# Patient Record
Sex: Male | Born: 1940 | Race: White | Hispanic: No | Marital: Married | Smoking: Former smoker
Health system: Southern US, Community
[De-identification: ages and names within clinical notes are randomized; demographics above are authoritative.]

## PROBLEM LIST (undated history)

## (undated) DIAGNOSIS — G35 Multiple sclerosis: Secondary | ICD-10-CM

## (undated) DIAGNOSIS — R339 Retention of urine, unspecified: Secondary | ICD-10-CM

## (undated) DIAGNOSIS — Z9359 Other cystostomy status: Secondary | ICD-10-CM

## (undated) DIAGNOSIS — R310 Gross hematuria: Secondary | ICD-10-CM

## (undated) HISTORY — DX: Other cystostomy status: Z93.59

## (undated) HISTORY — DX: Retention of urine, unspecified: R33.9

## (undated) HISTORY — PX: OTHER SURGICAL HISTORY: SHX169

## (undated) HISTORY — DX: Multiple sclerosis: G35

## (undated) HISTORY — DX: Gross hematuria: R31.0

---

## 2004-03-30 ENCOUNTER — Inpatient Hospital Stay: Payer: Self-pay | Admitting: Internal Medicine

## 2004-03-30 ENCOUNTER — Other Ambulatory Visit: Payer: Self-pay

## 2004-05-08 ENCOUNTER — Ambulatory Visit: Payer: Self-pay | Admitting: Internal Medicine

## 2005-05-05 ENCOUNTER — Inpatient Hospital Stay: Payer: Self-pay | Admitting: Internal Medicine

## 2005-09-26 ENCOUNTER — Emergency Department: Payer: Self-pay | Admitting: Emergency Medicine

## 2006-05-05 ENCOUNTER — Ambulatory Visit: Payer: Self-pay | Admitting: Urology

## 2006-05-26 ENCOUNTER — Ambulatory Visit: Payer: Self-pay | Admitting: Urology

## 2006-05-26 ENCOUNTER — Other Ambulatory Visit: Payer: Self-pay

## 2006-06-01 ENCOUNTER — Ambulatory Visit: Payer: Self-pay | Admitting: Urology

## 2006-09-08 ENCOUNTER — Emergency Department: Payer: Self-pay | Admitting: Emergency Medicine

## 2006-09-08 ENCOUNTER — Other Ambulatory Visit: Payer: Self-pay

## 2006-09-21 ENCOUNTER — Ambulatory Visit: Payer: Self-pay | Admitting: Unknown Physician Specialty

## 2006-09-28 ENCOUNTER — Ambulatory Visit: Payer: Self-pay | Admitting: Internal Medicine

## 2006-10-18 ENCOUNTER — Ambulatory Visit: Payer: Self-pay | Admitting: Unknown Physician Specialty

## 2008-03-21 ENCOUNTER — Ambulatory Visit: Payer: Self-pay | Admitting: Internal Medicine

## 2008-03-22 ENCOUNTER — Ambulatory Visit: Payer: Self-pay | Admitting: Internal Medicine

## 2008-04-07 ENCOUNTER — Ambulatory Visit: Payer: Self-pay | Admitting: Internal Medicine

## 2008-06-05 ENCOUNTER — Ambulatory Visit: Payer: Self-pay | Admitting: Internal Medicine

## 2008-07-30 IMAGING — CR DG IVP HYPERTENSIVE
1 series · 8 of 10 positions shown · non-contrast
Comparison: none

REASON FOR EXAM: UTI
COMMENTS:

[Series 1: view not recorded · 0.17mm/px · 8 of 11 slices shown]
[im 1/11]
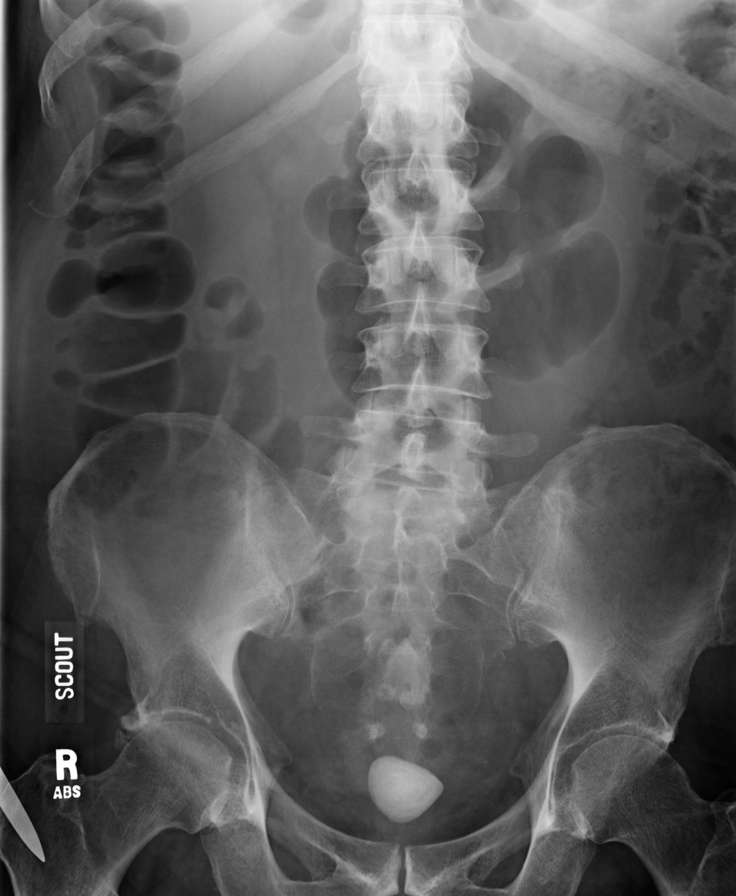
[im 2/11]
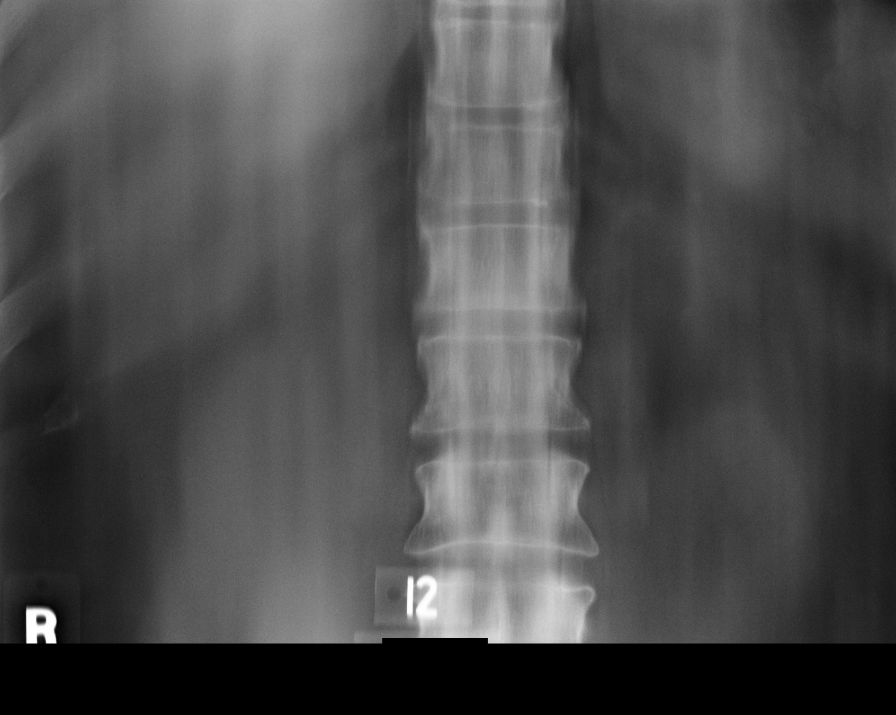
[im 3/11]
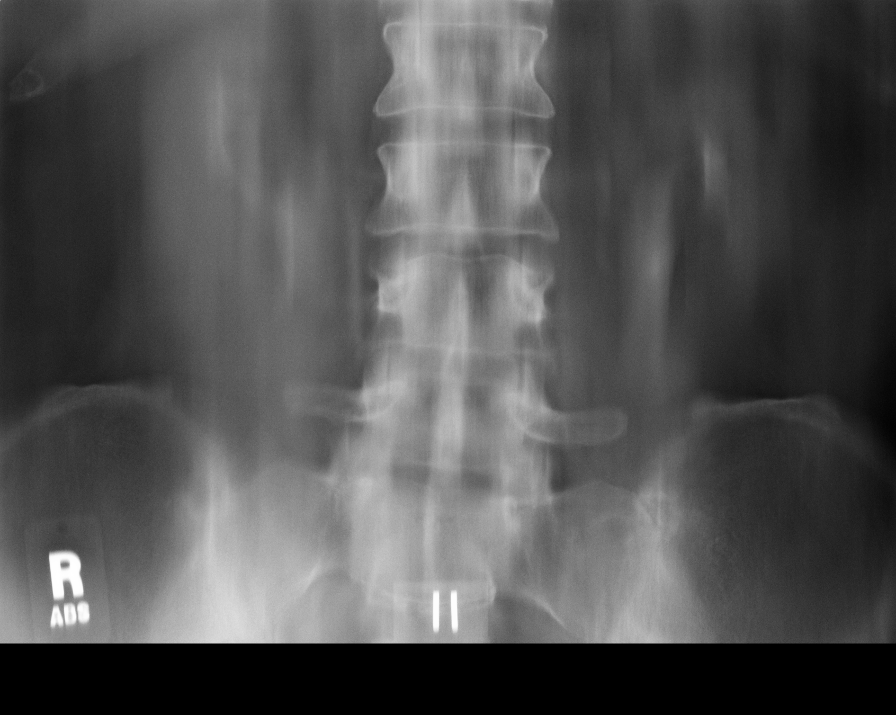
[im 4/11]
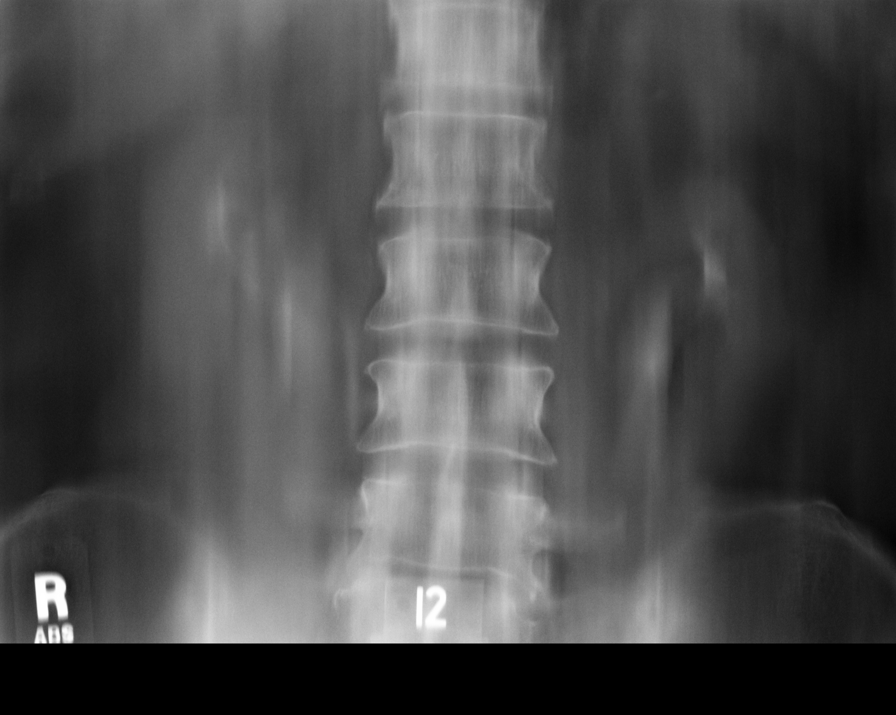
[im 5/11]
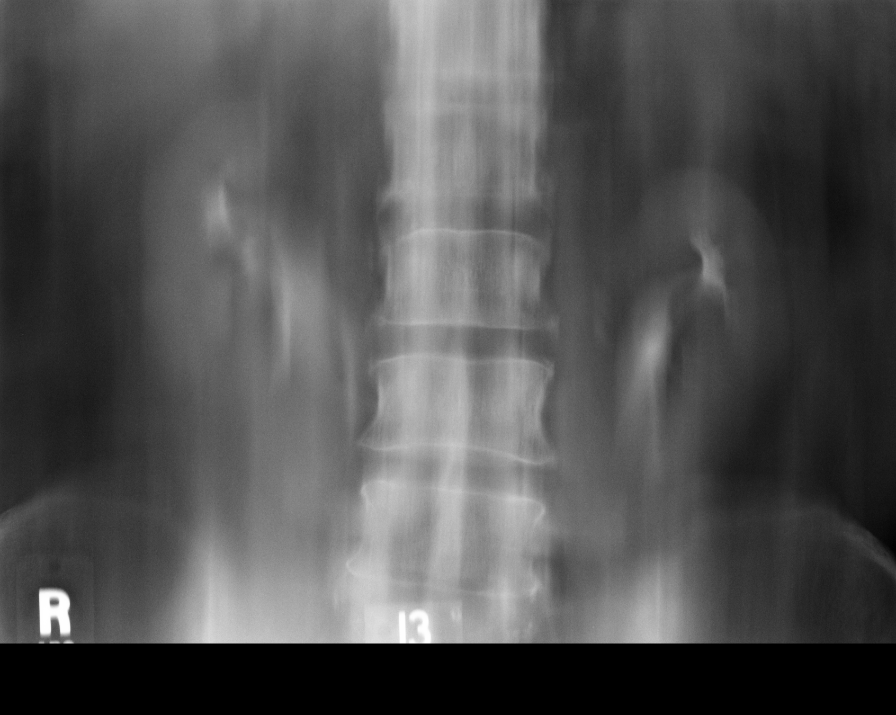
[im 6/11]
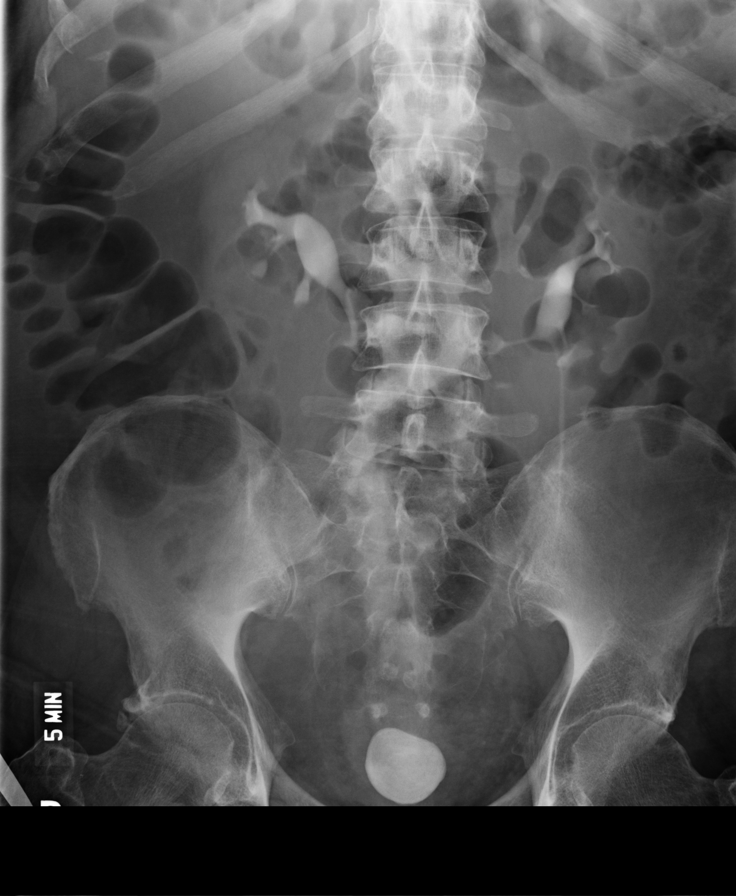
[im 7/11]
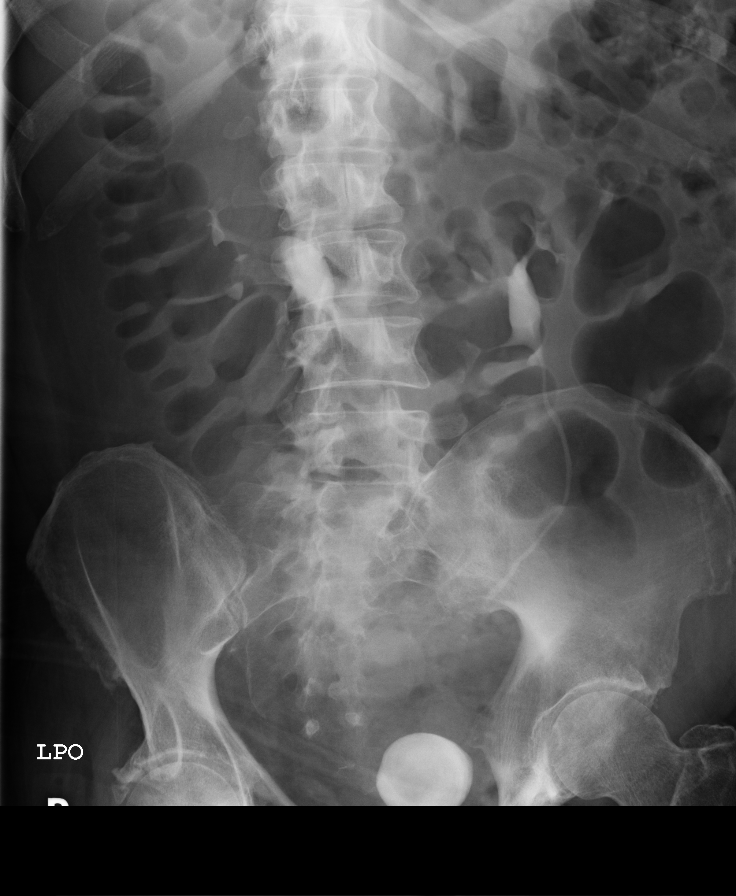
[im 8/11]
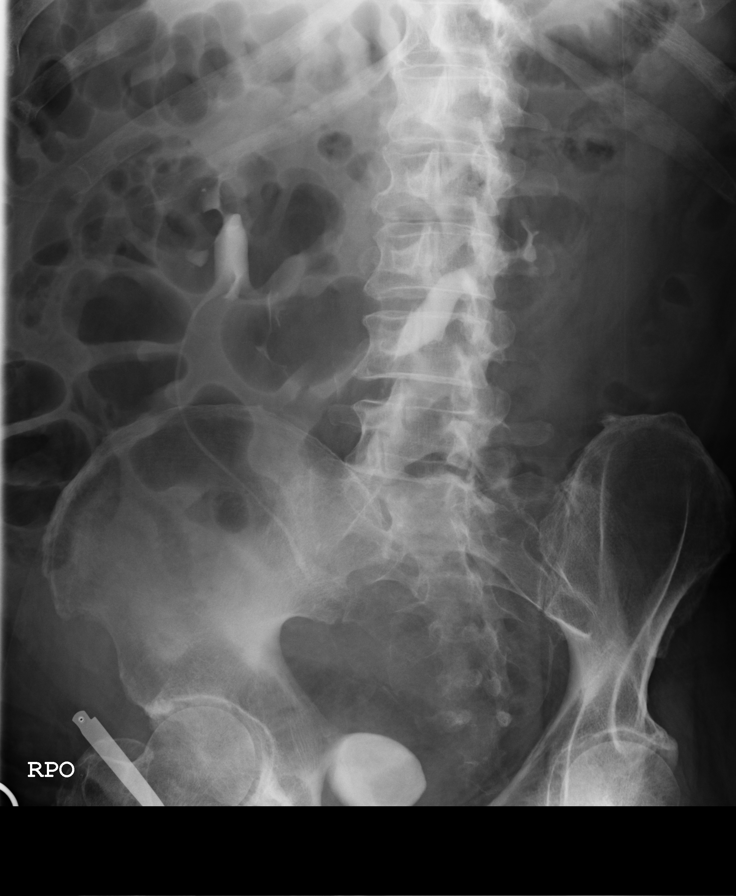

[8 of 10 positions shown; findings below may reference images not displayed]

PROCEDURE:     DXR - DXR INTRAVENOUS UROGRAPHY (IVP)  - May 05, 2006  [DATE]

RESULT:     A large laminated calcified density projects in the region of
the central lower true pelvis which appears to be consistent with a bladder
calculus.  Two vague calcified densities project along the medial aspect of
the RIGHT renal fossa.  This projects in the region of the transverse
process and may represent previous injury though medial calculi cannot be
completely excluded.  No further evidence of calcified densities project in
the region of the RIGHT or LEFT urinary collecting system.  Air is seen
within non-dilated loops of large and small bowel. The visualized bony
skeleton demonstrates no evidence of fracture nor dislocation.  Degenerative
changes are appreciated involving the RIGHT and LEFT hips.

Intravenous urogram was performed status post intravenous administration of
50 ml of Optiray 320.

Prompt symmetric nephrograms are appreciated, which appears to extend across
the midline.  There is subsequent symmetric excretion of contrast
appreciated into the RIGHT and LEFT renal pelves.  The renal pelves
demonstrate a medially oriented appearance and these findings are consistent
with a horseshoe kidney.  No gross filling defects are appreciated.  The
renal parenchyma is poorly opacified.  Evaluation of the fornices
demonstrates wispy fornices without blunting nor hydronephrosis.  The renal
pelves otherwise demonstrate no evidence of filling defects nor further
abnormalities. The RIGHT and LEFT ureters are identified to the level of the
urinary bladder and demonstrate no abnormalities. A suprapubic catheter is
appreciated with contrast drainage.  The partially distended urinary bladder
demonstrates no radiographic abnormalities.
IMPRESSION: 1)Findings consistent with a large laminated calculus in the urinary
bladder.

2)Findings also consistent with a horseshoe kidney.  No further
abnormalities are appreciated. The findings of the horseshoe kidney can be
further evaluated with cross sectional imaging if and as clinically
warranted.

## 2008-12-24 ENCOUNTER — Ambulatory Visit: Payer: Self-pay | Admitting: Unknown Physician Specialty

## 2009-01-09 ENCOUNTER — Ambulatory Visit: Payer: Self-pay | Admitting: Surgery

## 2009-01-15 ENCOUNTER — Ambulatory Visit: Payer: Self-pay | Admitting: Surgery

## 2009-11-20 ENCOUNTER — Ambulatory Visit: Payer: Self-pay | Admitting: Internal Medicine

## 2011-05-20 ENCOUNTER — Ambulatory Visit: Payer: Self-pay | Admitting: Internal Medicine

## 2012-10-10 ENCOUNTER — Ambulatory Visit: Payer: Self-pay | Admitting: Internal Medicine

## 2013-09-18 ENCOUNTER — Ambulatory Visit: Payer: Self-pay | Admitting: Urology

## 2014-01-15 DIAGNOSIS — IMO0001 Reserved for inherently not codable concepts without codable children: Secondary | ICD-10-CM | POA: Insufficient documentation

## 2014-01-15 DIAGNOSIS — K921 Melena: Secondary | ICD-10-CM | POA: Insufficient documentation

## 2014-01-16 ENCOUNTER — Ambulatory Visit: Payer: Self-pay | Admitting: Unknown Physician Specialty

## 2014-04-16 DIAGNOSIS — E039 Hypothyroidism, unspecified: Secondary | ICD-10-CM | POA: Insufficient documentation

## 2014-04-16 DIAGNOSIS — D649 Anemia, unspecified: Secondary | ICD-10-CM | POA: Insufficient documentation

## 2014-04-16 DIAGNOSIS — N319 Neuromuscular dysfunction of bladder, unspecified: Secondary | ICD-10-CM | POA: Insufficient documentation

## 2014-10-17 DIAGNOSIS — G35 Multiple sclerosis: Secondary | ICD-10-CM | POA: Insufficient documentation

## 2015-04-30 ENCOUNTER — Telehealth: Payer: Self-pay

## 2015-04-30 NOTE — Telephone Encounter (Signed)
Becky from home health called stating pt SP tube has a hole in foley tube. Per Dr. Apolinar Junes orders were given for a one time SP tube change.  Pt needs to be seen for f/u appt.

## 2015-05-07 ENCOUNTER — Encounter: Payer: Self-pay | Admitting: *Deleted

## 2015-05-07 DIAGNOSIS — K635 Polyp of colon: Secondary | ICD-10-CM | POA: Insufficient documentation

## 2015-05-08 ENCOUNTER — Ambulatory Visit: Payer: Self-pay | Admitting: Obstetrics and Gynecology

## 2015-05-09 ENCOUNTER — Ambulatory Visit (INDEPENDENT_AMBULATORY_CARE_PROVIDER_SITE_OTHER): Payer: Medicare HMO | Admitting: Obstetrics and Gynecology

## 2015-05-09 ENCOUNTER — Encounter: Payer: Self-pay | Admitting: Obstetrics and Gynecology

## 2015-05-09 VITALS — BP 167/88 | HR 61 | Resp 16

## 2015-05-09 DIAGNOSIS — N39498 Other specified urinary incontinence: Secondary | ICD-10-CM

## 2015-05-09 DIAGNOSIS — R911 Solitary pulmonary nodule: Secondary | ICD-10-CM

## 2015-05-09 DIAGNOSIS — Q631 Lobulated, fused and horseshoe kidney: Secondary | ICD-10-CM

## 2015-05-09 DIAGNOSIS — N200 Calculus of kidney: Secondary | ICD-10-CM

## 2015-05-09 NOTE — Progress Notes (Signed)
05/09/2015 4:56 PM   Scott Valdez 07/27/1940 [id number]  Referring provider: No referring provider defined for this encounter.  Chief Complaint  Patient presents with  . OTHER    SP tube check  . Urinary Retention    HPI:   Patient is a 75 year old man with a history of multiple sclerosis and urinary incontinence managed with a suprapubic tube since 1990. He presents today with his wife with complaints of clogging of catheter and leakage from his penis. His wife reports over the last month she has noticed increased sediment in his catheter tubing and clogging of the tube. She reports that she can manipulate the tubing and sometimes unclog it herself. Most recent catheter change was 1 week ago by home health nurse. They have noted a strong odor to his urine. 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 He states that he is also had bladder stones in the past that were removed by Dr. Orson Slick. He has not been seen in our office since May of last year. He denies any systemic symptoms including fevers, abdominal pain, malaise or flank pain. No gross hematuria.  Per Dr. Scheryl Darter last note patient has a 4 mm nonobstructive stone in the left kidney and a small lung nodule that was seen on CT scan needing follow-up in one year. He recommended no intervention for his kidney stone unless patient becomes symptomatic. Last office visit 09/2014.  PMH: Past Medical History  Diagnosis Date  . MS (multiple sclerosis) (HCC)   . Chronic suprapubic catheter (HCC)   . Urinary retention   . Gross hematuria     Surgical History: Past Surgical History  Procedure Laterality Date  . Cystolithopaxy    . Suprapubic tube placement      Home Medications:    Medication List       This list is accurate as of: 05/09/15  4:56 PM.  Always use your most recent med list.               levothyroxine 175 MCG tablet  Commonly known as:  SYNTHROID, LEVOTHROID  Take by mouth.     RA  COMPLETE ALLERGY 25 MG tablet  Generic drug:  diphenhydrAMINE  TAKE 1 TO 2 TABLETS EVERY 4 HOURS AS NEEDED FOR ITCHING (NOT TO EXCEED 6 DOSES/24 HOURS)        Allergies:  Allergies  Allergen Reactions  . Pseudoephedrine Hcl     Other reaction(s): Unknown  . Ranitidine Hcl     Other reaction(s): Unknown  . Sucralfate     Other reaction(s): Unknown    Family History: Family History  Problem Relation Age of Onset  . Diabetes Mother     Social History:  reports that he quit smoking about 45 years ago. He does not have any smokeless tobacco history on file. He reports that he does not drink alcohol. His drug history is not on file.  ROS: UROLOGY Frequent Urination?: No Hard to postpone urination?: No Burning/pain with urination?: No Get up at night to urinate?: No Leakage of urine?: No Urine stream starts and stops?: No Trouble starting stream?: No Do you have to strain to urinate?: No Blood in urine?: No Urinary tract infection?: No Sexually transmitted disease?: No Injury to kidneys or bladder?: No Painful intercourse?: No Weak stream?: No Erection problems?: No Penile pain?: No  Gastrointestinal Nausea?: No Vomiting?: No Indigestion/heartburn?: No Diarrhea?: No Constipation?: No  Constitutional Fever: No Night sweats?: No Weight loss?: No Fatigue?:  No  Skin Skin rash/lesions?: No Itching?: No  Eyes Blurred vision?: No Double vision?: No  Ears/Nose/Throat Sore throat?: No Sinus problems?: No  Hematologic/Lymphatic Swollen glands?: No Easy bruising?: No  Cardiovascular Leg swelling?: Yes Chest pain?: No  Respiratory Cough?: No Shortness of breath?: No  Endocrine Excessive thirst?: No  Musculoskeletal Back pain?: No Joint pain?: No  Neurological Headaches?: No Dizziness?: No  Psychologic Depression?: No Anxiety?: No  Physical Exam: BP 167/88 mmHg  Pulse 61  Resp 16  Ht   Wt   Constitutional:  Alert and oriented, No  acute distress. HEENT: [place] AT, moist mucus membranes.  Trachea midline, no masses. Cardiovascular: No clubbing, cyanosis, or edema. Respiratory: Normal respiratory effort, no increased work of breathing. GI: Abdomen is soft, nontender, nondistended, no abdominal masses GU: No CVA tenderness.  Catheter draining well from SP site, significant sediment noted in tubing, small amount of granulation tissue at site without active bleeding Skin: No rashes, bruises or suspicious lesions. Lymph: No cervical or inguinal adenopathy. Neurologic: Grossly intact, no focal deficits, wheelchair bound, able to move upper extremities some Psychiatric: Normal mood and affect.  Laboratory Data:   Urinalysis No results found for: COLORURINE, APPEARANCEUR, LABSPEC, PHURINE, GLUCOSEU, HGBUR, BILIRUBINUR, KETONESUR, PROTEINUR, UROBILINOGEN, NITRITE, LEUKOCYTESUR  Pertinent Imaging:   Assessment & Plan:    1. Urinary Incontinence-  Managed with long term s/p tube.  Changed monthly by home health nurse. Large amount of sediment noted in tubing.  Draining well today. Encouraged increased fluid intake. History of bladder stones. Will schedule cystoscopy. UA obtained today from catheter hub.  Quantity insufficient for microscopy or culture. Patient provided specimen cup and will return with sample.  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  2. Horseshoe Kidney with renal calculi- RTC for KUB/RUS results. -KUB -RUS  3. Lung Nodule-  Patient wife informed that patient had a lung nodule seen on prior CT scan per Dr. Scheryl Darter note.  I am unable to see images or report.  I recommended that patient f/u with his PCP regarding recommended repeat CT.  There are no diagnoses linked to this encounter.  Return for KUB/RUS results and cystoscopy.  These notes generated with voice recognition software. I apologize for typographical  errors.  Earlie Lou, FNP  [facility] [address], [address] [place], [place] [address] [phone]

## 2015-05-10 LAB — URINALYSIS, COMPLETE
Bilirubin, UA: NEGATIVE
Bilirubin, UA: NEGATIVE
GLUCOSE, UA: NEGATIVE
Glucose, UA: NEGATIVE
KETONES UA: NEGATIVE
Ketones, UA: NEGATIVE
Nitrite, UA: POSITIVE — AB
Nitrite, UA: POSITIVE — AB
PH UA: 7 (ref 5.0–7.5)
Specific Gravity, UA: 1.02 (ref 1.005–1.030)
Specific Gravity, UA: 1.02 (ref 1.005–1.030)
UUROB: 0.2 mg/dL (ref 0.2–1.0)
Urobilinogen, Ur: 0.2 mg/dL (ref 0.2–1.0)
pH, UA: 7 (ref 5.0–7.5)

## 2015-05-10 LAB — MICROSCOPIC EXAMINATION: Epithelial Cells (non renal): NONE SEEN /hpf (ref 0–10)

## 2015-05-10 NOTE — Addendum Note (Signed)
Addended by: Avie Echevaria on: 05/10/2015 10:54 AM   Modules accepted: Orders

## 2015-05-13 LAB — CULTURE, URINE COMPREHENSIVE

## 2015-05-16 ENCOUNTER — Telehealth: Payer: Self-pay

## 2015-05-16 NOTE — Telephone Encounter (Signed)
Spoke with pt wife in reference ucx results. Wife stated pt is not having any symptoms are this time. Made wife aware at this time we will not treat. Wife voiced understanding.

## 2015-05-16 NOTE — Telephone Encounter (Signed)
-----   Message from Fernanda Drum, FNP sent at 05/16/2015 11:09 AM EST ----- Please notify patient and his wife that his urine culture result was positive for multiple organisms but none more predominant. This is typically consistent with chronic colonization which is typical for someone that has an indwelling catheter. Should he develop symptoms of fevers and malaise or other urinary symptoms we can recheck his urine but otherwise he does not need to be treated for an infection. Thanks

## 2015-05-21 ENCOUNTER — Ambulatory Visit
Admission: RE | Admit: 2015-05-21 | Discharge: 2015-05-21 | Disposition: A | Discharge status: Home / Self Care | Payer: Medicare HMO | Source: Ambulatory Visit | Attending: Obstetrics and Gynecology | Admitting: Obstetrics and Gynecology

## 2015-05-21 DIAGNOSIS — N200 Calculus of kidney: Secondary | ICD-10-CM | POA: Diagnosis present

## 2015-05-21 DIAGNOSIS — Q631 Lobulated, fused and horseshoe kidney: Secondary | ICD-10-CM | POA: Insufficient documentation

## 2015-05-22 ENCOUNTER — Telehealth: Payer: Self-pay

## 2015-05-22 NOTE — Telephone Encounter (Signed)
Pt wife called this morning stating pt SP tube has become clogged. Wife stated she has tried to irrigate catheter and that does not seem to help. Per wife home health needs an order to change SP tube.  Nurse called [facility] and gave verbal orders to [facility], to change catheter.  Spoke with wife and made aware of orders given. Wife voiced understanding.

## 2015-05-24 ENCOUNTER — Ambulatory Visit (INDEPENDENT_AMBULATORY_CARE_PROVIDER_SITE_OTHER): Payer: Medicare HMO | Admitting: Urology

## 2015-05-24 VITALS — BP 172/85 | HR 70 | Ht 67.0 in | Wt 200.0 lb

## 2015-05-24 DIAGNOSIS — N319 Neuromuscular dysfunction of bladder, unspecified: Secondary | ICD-10-CM

## 2015-05-24 MED ORDER — LIDOCAINE HCL 2 % EX GEL
1.0000 "application " | Freq: Once | CUTANEOUS | Status: AC
  Administered 2015-05-24: 1 via URETHRAL

## 2015-05-24 MED ORDER — CIPROFLOXACIN HCL 500 MG PO TABS
500.0000 mg | ORAL_TABLET | Freq: Once | ORAL | Status: AC
  Administered 2015-05-24: 500 mg via ORAL

## 2015-05-24 NOTE — Progress Notes (Signed)
05/24/2015 3:16 PM   Scott Valdez 24-Nov-1940 [id number]  Referring provider: Rafael Bihari, MD [phone] [facility] [address] [facility] [place], [place] [address]  Chief Complaint  Patient presents with  . Cysto    HPI: Patient is a 75 year old man with a history of multiple sclerosis and urinary incontinence managed with a suprapubic tube since 1990. He presents today with his wife with complaints of clogging of catheter and leakage from his penis. His wife reports over the last month she has noticed increased sediment in his catheter tubing and clogging of the tube. She reports that she can manipulate the tubing and sometimes unclog it herself. Most recent catheter change was 1 week ago by home health nurse. They have noted a strong odor to his urine. 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 He states that he is also had bladder stones in the past that were removed by Dr. Orson Slick. He has not been seen in our office since May of last year. He denies any systemic symptoms including fevers, abdominal pain, malaise or flank pain. No gross hematuria.  Per Dr. Scheryl Darter last note patient has a 4 mm nonobstructive stone in the left kidney and a small lung nodule that was seen on CT scan needing follow-up in one year. He recommended no intervention for his kidney stone unless patient becomes symptomatic. Last office visit 09/2014.     January 2017 interval history: The patient presents for follow-up with cystoscopy today. He had a renal ultrasound and KUB KUB was positive for constipation stones. There is 2 echogenic regions and his horseshoe kidney that maybe 4-5 mm stone. They're not visible on KUB   PMH: Past Medical History  Diagnosis Date  . MS (multiple sclerosis) (HCC)   . Chronic suprapubic catheter (HCC)   . Urinary retention   . Gross hematuria     Surgical History: Past Surgical History  Procedure Laterality Date  . Cystolithopaxy    .  Suprapubic tube placement      Home Medications:    Medication List       This list is accurate as of: 05/24/15  3:16 PM.  Always use your most recent med list.               levothyroxine 175 MCG tablet  Commonly known as:  SYNTHROID, LEVOTHROID  Take by mouth.        Allergies:  Allergies  Allergen Reactions  . Pseudoephedrine Hcl     Other reaction(s): Unknown  . Ranitidine Hcl     Other reaction(s): Unknown  . Sucralfate     Other reaction(s): Unknown    Family History: Family History  Problem Relation Age of Onset  . Diabetes Mother     Social History:  reports that he quit smoking about 45 years ago. He does not have any smokeless tobacco history on file. He reports that he does not drink alcohol. His drug history is not on file.  ROS:                                        Physical Exam: BP 172/85 mmHg  Pulse 70  Ht  (1.702 m)  Wt 200 lb (90.719 kg)  BMI 31.32 kg/m2  Constitutional:  Alert and oriented, No acute distress. HEENT: [place] AT, moist mucus membranes.  Trachea midline, no masses.  Cardiovascular: No clubbing, cyanosis, or edema. Respiratory: Normal respiratory effort, no increased work of breathing. GI: Abdomen is soft, nontender, nondistended, no abdominal masses GU: No CVA tenderness.  Skin: No rashes, bruises or suspicious lesions. Lymph: No cervical or inguinal adenopathy. Neurologic: Grossly intact, no focal deficits, moving all 4 extremities. Psychiatric: Normal mood and affect.  Laboratory Data: No results found for: WBC, HGB, HCT, MCV, PLT  No results found for: CREATININE  No results found for: PSA  No results found for: TESTOSTERONE  No results found for: HGBA1C  Urinalysis    Component Value Date/Time   GLUCOSEU Negative 05/10/2015 1056   BILIRUBINUR Negative 05/10/2015 1056   NITRITE Positive* 05/10/2015 1056   LEUKOCYTESUR 1+* 05/10/2015 1056    Pertinent Imaging:  CLINICAL DATA:  Horseshoe kidney with renal calculus  EXAM: RENAL / URINARY TRACT ULTRASOUND COMPLETE  COMPARISON: CT scan 09/18/2013  FINDINGS: Again noted horseshoe kidney. No hydronephrosis. A small cyst noted in right lower pole moiety. Cyst in the interpolar region connecting the kidneys measures 2.7 cm. Cyst in left upper pole moiety measures 4.8 cm. The left kidney measures about 13.5 cm in length. Right kidney measures about 9.5 cm in length. There is a nonobstructive calculus in right moiety measures about 5 x 6 mm.  Bladder:  The urinary bladder is decompressed. There is a suprapubic catheter.  IMPRESSION: Again noted horseshoe kidney. No hydronephrosis. A small cyst noted in right lower pole moiety. Cyst in the interpolar region connecting the kidneys measures 2.7 cm. Cyst in left upper pole moiety measures 4.8 cm. The left kidney measures about 13.5 cm in length. Right kidney measures about 9.5 cm in length. There is a nonobstructive calculus in right moiety measures about 5 x 6 mm. CLINICAL DATA: Blood coming out of catheter.  EXAM: ABDOMEN - 1 VIEW  COMPARISON: None.  FINDINGS: The bowel gas pattern is normal. Extensive bowel content is identified throughout colon No radio-opaque calculi or other significant radiographic abnormality are seen.  IMPRESSION: No acute abnormality. Constipation.    Cystoscopy Procedure Note  Patient identification was confirmed, informed consent was obtained, and patient was prepped using Betadine solution.  Lidocaine jelly was administered per urethral meatus.    Preoperative abx where received prior to procedure.     Pre-Procedure: - Inspection reveals a normal caliber ureteral meatus.  Procedure: SP Tube was clamped The flexible cystoscope was introduced without difficulty - No urethral strictures/lesions are present. - Enlarged prostate  - Normal bladder neck - Bilateral ureteral orifices identified - Bladder  mucosa  reveals no ulcers, tumors, or lesions - No bladder stones - No trabeculation -  No masses noted dome of the bladder where the SP tube entered the bladder  Retroflexion shows no intravesical lobe  SP tube was unclamped  Post-Procedure: - Patient tolerated the procedure well    Assessment & Plan:    1. Urinary retention .- continue suprapubic tube. He will have his SP tube changed by his home nurse monthly or sooner if needed. His wife is instructed to irrigate the tube daily. He'll be due for repeat cystoscopy in one year due to chronic suprapubic tube.  2. Horseshoe Kidney with renal calculi He does have two 4-5 Millimeter hyperechoic areas in his kidney that could be stones. These are not visible on KUB however.  These may just be artifact on his ultrasound. We'll have him follow-up in one year with a renal ultrasound prior to ensure that these possible stones do not grow  in size.    Return in about 1 year (around 05/23/2016) for with renal ultrasound prior with cystoscopy .  Hildred Laser, MD  [facility] [address] [place], [place] [address] [phone]

## 2016-01-19 IMAGING — US US RENAL
1 series · 14 of 25 positions shown · non-contrast
Comparison: CT scan 09/18/2013

CLINICAL DATA: Horseshoe kidney with renal calculus

EXAM:
RENAL / URINARY TRACT ULTRASOUND COMPLETE

[Series 1: us renal · 0.25mm/px · 14 of 43 slices shown]
[im 1/43]
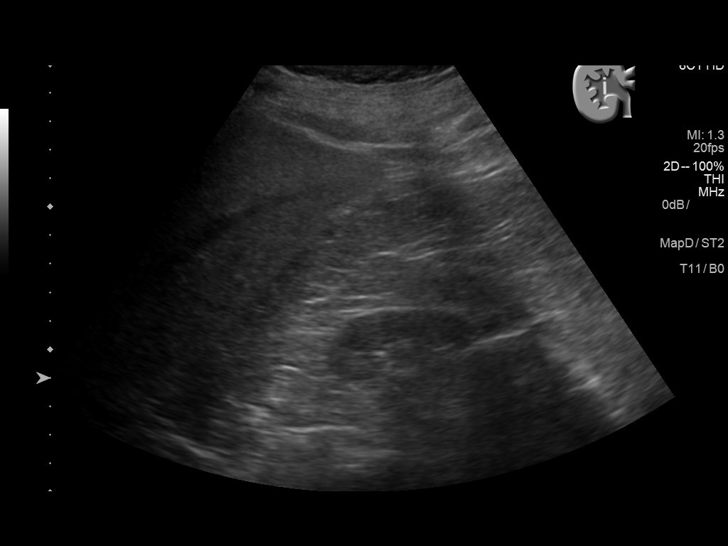
[im 4/43]
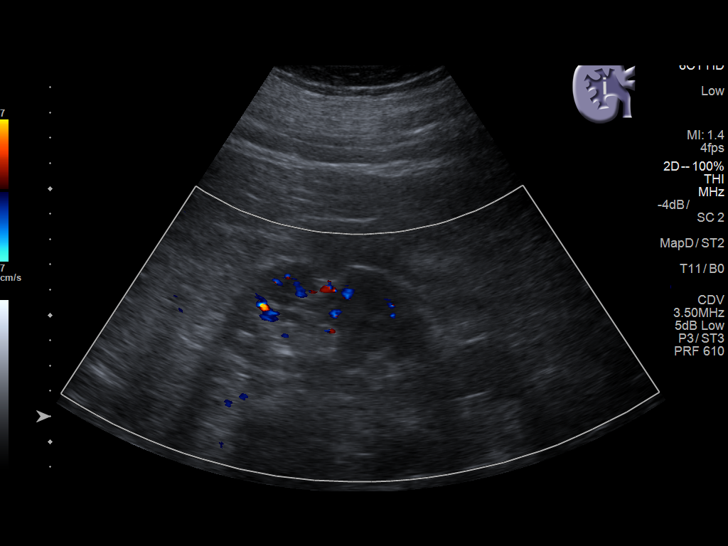
[im 8/43]
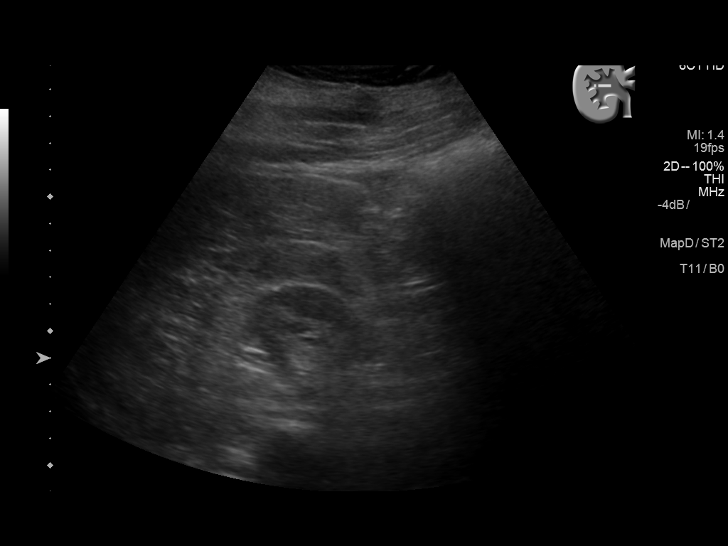
[im 11/43]
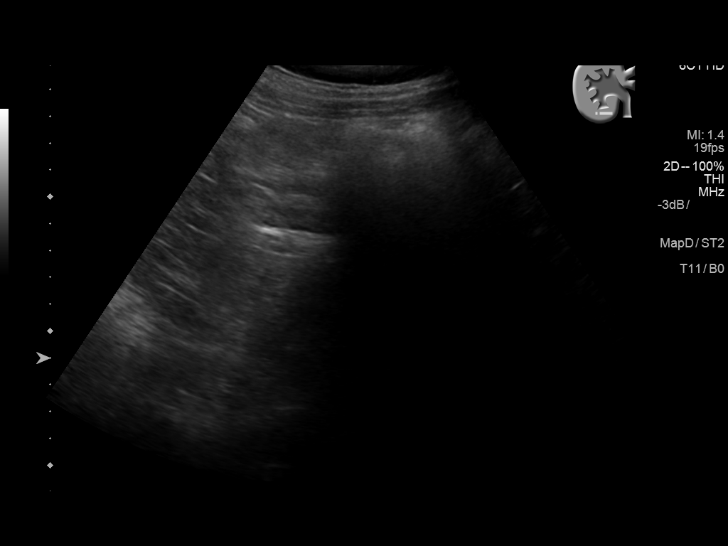
[im 15/43]
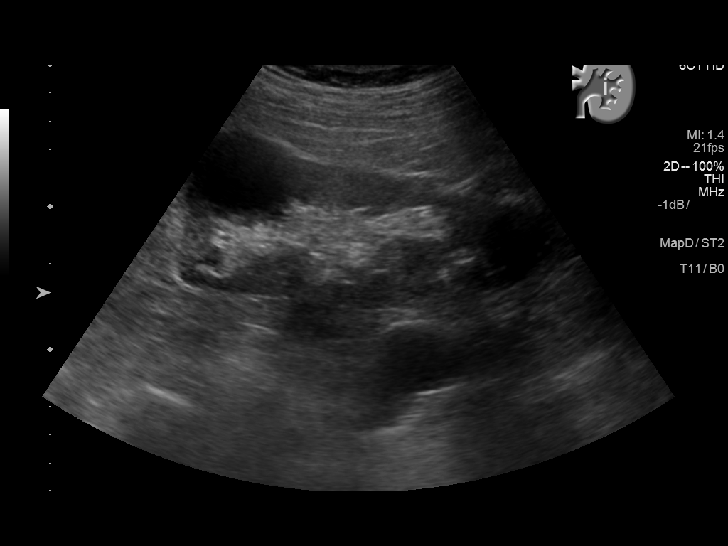
[im 16/43]
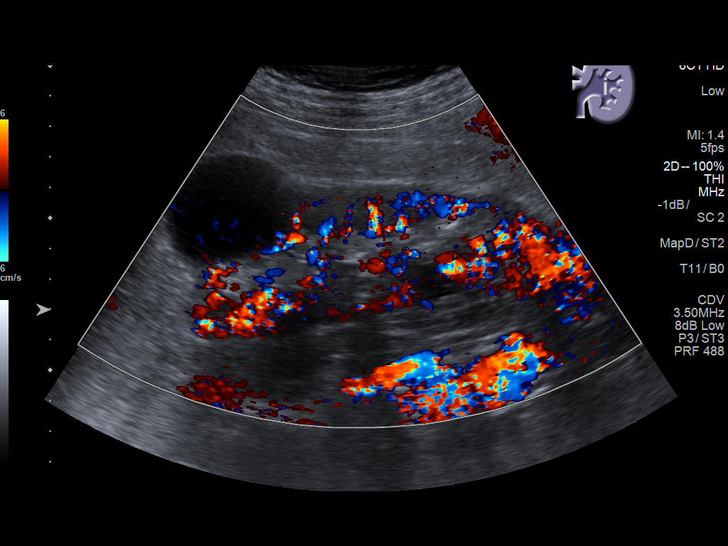
[im 20/43]
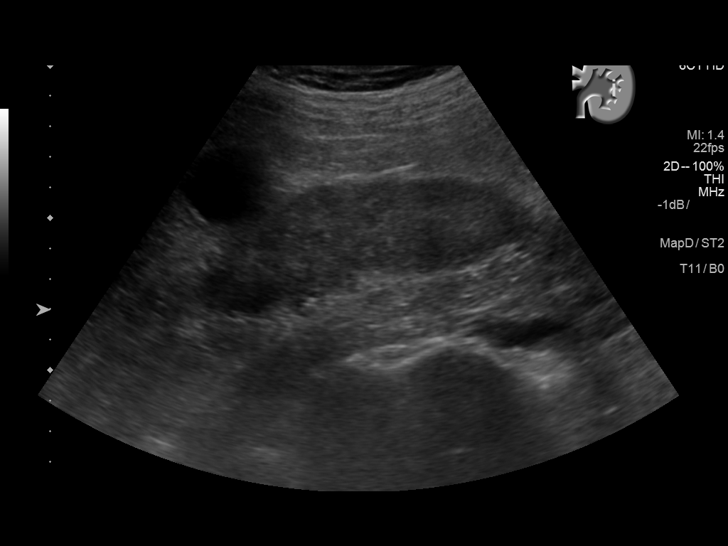
[im 23/43]
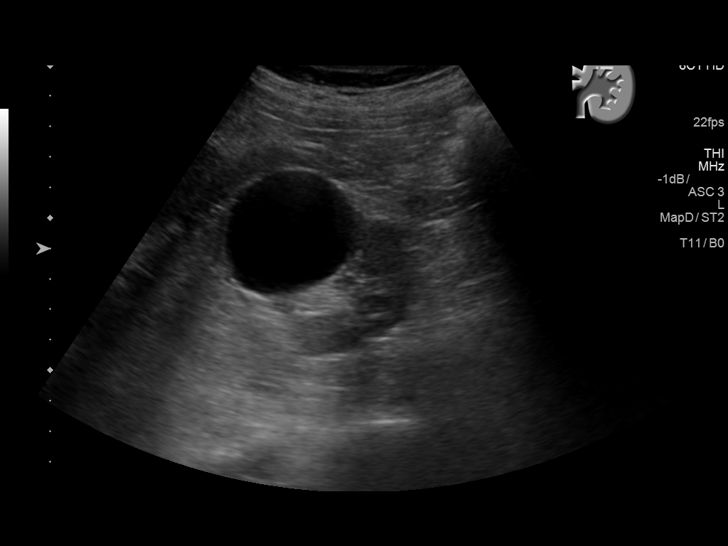
[im 27/43]
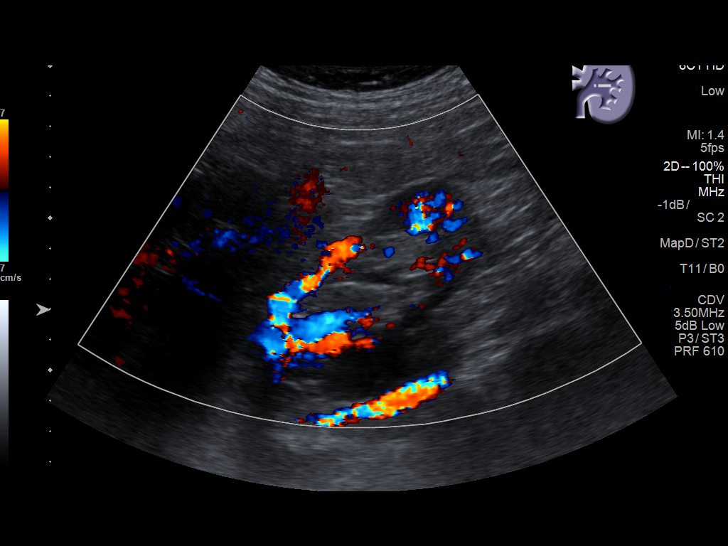
[im 29/43]
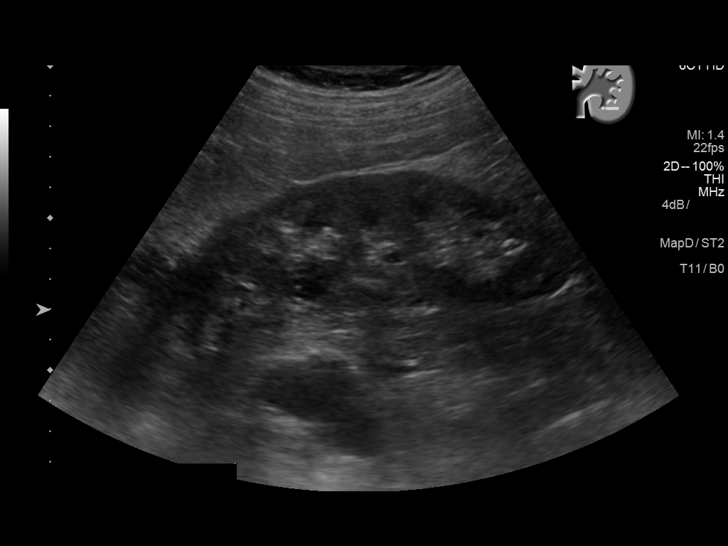
[im 32/43]
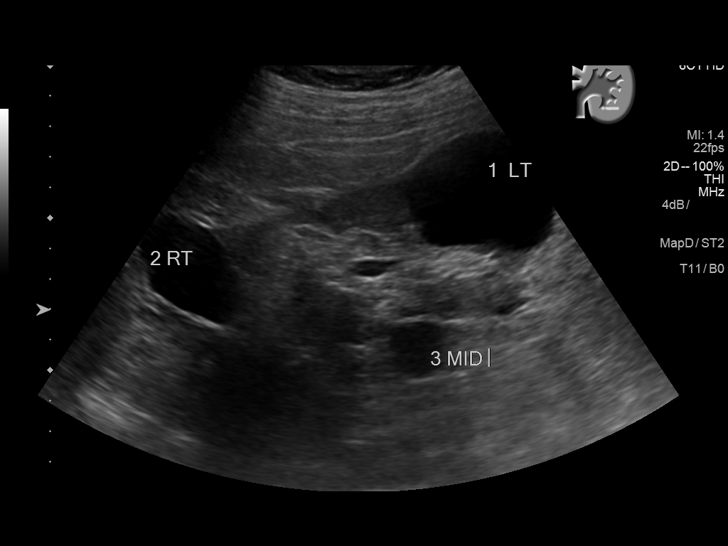
[im 36/43]
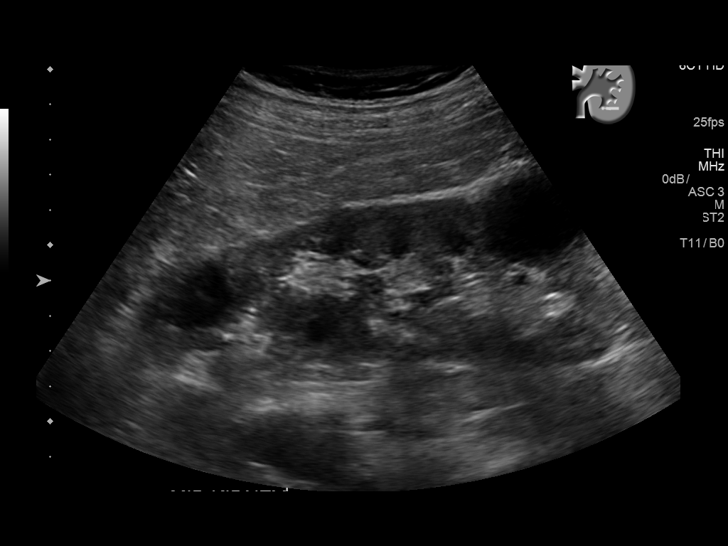
[im 39/43]
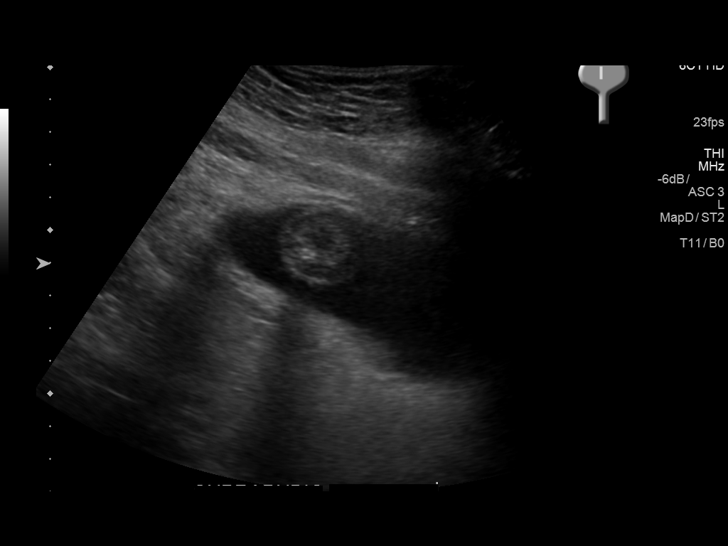
[im 43/43]
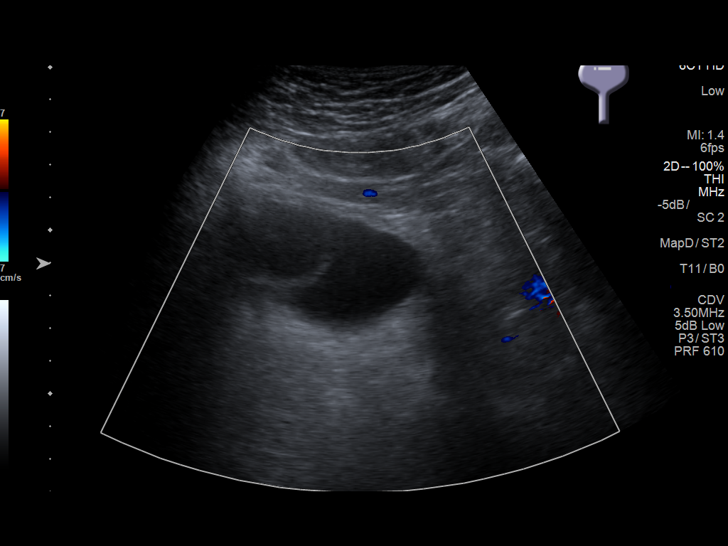

[14 of 25 positions shown; findings below may reference images not displayed]

FINDINGS: Again noted horseshoe kidney. No hydronephrosis. A small cyst noted
in right lower pole moiety. Cyst in the interpolar region connecting
the kidneys measures 2.7 cm. Cyst in left upper pole moiety measures
4.8 cm. The left kidney measures about 13.5 cm in length. Right
kidney measures about 9.5 cm in length. There is a nonobstructive
calculus in right moiety measures about 5 x 6 mm.

Bladder:

The urinary bladder is decompressed. There is a suprapubic catheter.
IMPRESSION: Again noted horseshoe kidney. No hydronephrosis. A small cyst noted
in right lower pole moiety. Cyst in the interpolar region connecting
the kidneys measures 2.7 cm. Cyst in left upper pole moiety measures
4.8 cm. The left kidney measures about 13.5 cm in length. Right
kidney measures about 9.5 cm in length. There is a nonobstructive
calculus in right moiety measures about 5 x 6 mm.

## 2016-04-20 ENCOUNTER — Ambulatory Visit: Payer: Medicare HMO | Attending: Internal Medicine | Admitting: Physical Therapy

## 2016-04-20 ENCOUNTER — Encounter: Payer: Self-pay | Admitting: Physical Therapy

## 2016-04-20 DIAGNOSIS — R278 Other lack of coordination: Secondary | ICD-10-CM | POA: Diagnosis not present

## 2016-04-20 DIAGNOSIS — R2689 Other abnormalities of gait and mobility: Secondary | ICD-10-CM | POA: Diagnosis present

## 2016-04-20 NOTE — Therapy (Signed)
[place] MAIN [facility] SERVICES [address] [place], [place], [address] Phone: [phone]   Fax:  [phone]  Physical Therapy Wheelchair Evaluation  Patient Details  Name: Scott Valdez MRN: [id number] Date of Birth: 09/29/1940 No Data Recorded  Encounter Date: 04/20/2016      PT End of Session - 04/20/16 1601    Visit Number 1   Number of Visits 1   Date for PT Re-Evaluation 04/20/16   PT Start Time 1440   PT Stop Time 1600   PT Time Calculation (min) 80 min   Equipment Utilized During Treatment Gait belt   Activity Tolerance Patient tolerated treatment well;No increased pain   Behavior During Therapy WFL for tasks assessed/performed      Past Medical History:  Diagnosis Date  . Chronic suprapubic catheter ([place])   . Gross hematuria   . MS (multiple sclerosis) ([place])   . Urinary retention     Past Surgical History:  Procedure Laterality Date  . Cystolithopaxy    . Suprapubic tube placement      There were no vitals filed for this visit.       PATIENT INFORMATION: Name: Scott Valdez DOB:   Apr 11, 1941   Sex: M Date seen:12/18/17Time:2:45 PM   [address]  [place], [place] [address] Physician: Dr. Gilford Rile This evaluation/justification form will serve as the LMN for the following suppliers: __________________________ Supplier: [employer] Contact Person: Yvone Neu Phone: [phone]   Seating Therapist:Raquan Valdez E. Rona Ravens, DPT Phone:   [phone]   Phone: [phone]    Spouse/Parent/Caregiver name:  Scott Valdez Phone number:  Insurance/Payer: [employer],  Medicare Advantage     Reason for Referral: New power chair  Patient/Caregiver Goals: "I want a new power chair"   Patient was seen for face-to-face evaluation for new power wheelchair.  Also present was    [employer]  to discuss recommendations and wheelchair options.  Further paperwork was completed and sent to vendor.   Patient appears to qualify for power mobility device at this time per objective findings.   MEDICAL HISTORY: Diagnosis:     MS                                Primary Diagnosis: MS Onset: 1986 Diagnosis: Multiple Sclerosis, neurogenic bladder   _0 Progressive Disease Relevant past and future surgeries/Medical History: Suprapubic catheter for Neurogenic Bladder since 1990   Height: 5'7" Weight: 200 pounds approx.  Explain recent changes or trends in weight: no recent changes;    History including Falls: none    HOME ENVIRONMENT: _1 House  _2 Condo/town home  _3 Apartment  _4 Assisted Living    _5 Lives Alone _6  Lives with Others  (wife)  -24/7                   Hours with caregiver: has home aide for 1 hour 3x a week  _7 Home is accessible to patient           Stairs      _8 Yes _9  No     Ramp _10 Yes _11 No Comments:     COMMUNITY ADL: TRANSPORTATION: _12 Car    _13 Van    <GYIRSWNIOEVOJJKK>_9<\/FGHWEXHBZJIRCVEL>_38 Public Transportation    _15 Adapted w/c Lift    _16 Ambulance    _17 Other:       _18 Sits in wheelchair during transport  Employment/School:    Retired;  Specific requirements pertaining to mobility  Other:   Pt will ride in regular seat in [place]; lift on back of Lucianne Lei will hold up to 300#                                  FUNCTIONAL/SENSORY PROCESSING SKILLS:  Handedness:   _0 Right     _1 Left    _2 NA  Comments:                                 Functional Processing Skills for Wheeled Mobility _3 Processing Skills are adequate for safe wheelchair operation  Areas of concern than may interfere with safe operation of wheelchair Description of problem   _4  Attention to environment      _5 Judgment      _6  Hearing  _7  Vision or visual processing      _8 Motor Planning  _9  Fluctuations in Behavior     Blind in right eye; occasionally will bump into wall/doorway; does fairly well when in community                        VERBAL COMMUNICATION: _10 WFL receptive _11  WFL expressive  _12 Understandable  _13 Difficult to understand  _14 non-communicative _15  Uses an augmented communication device; pt has slurred speech and requires assistance from spouse for communication; able to follow commands;   CURRENT SEATING / MOBILITY: Current Mobility Base:  _16 None _17 Dependent _18 Manual _19 Scooter _20 Power  Type of Control:                       Manufacturer:  unsure           Size:                         Age:         4 years approximately    (pwr);                Current Condition of Mobility Base:      Occasionally will have trouble stopping chair because it will keep on going   Current Wheelchair components:  Seat is tilted, foam is flat leaning to side as if going to slide out;                         Describe posture in present seating system:  Able to sit up straight; however with fatigue will lean to left side       SENSATION and SKIN ISSUES: Sensation _21 Intact  _22 Impaired _23 Absent  Level of sensation: some numbness in finger tips, but intact light touch sensation;                         Pressure Relief: Able to perform effective pressure relief :    _24 Yes  _25  No Method:    Will shift side/side in chair regularly; inconsistent with standing;  If not, Why?:                                                                          Skin Issues/Skin Integrity Current Skin Issues  _0 Yes _1 No _2 Intact _3  Red area_4  Open Area  _5 Scar Tissue _6 At risk from prolonged sitting Where   Sacral area; red blotches; MD treating skin but concerned about skin breakdown;                            History of Skin Issues  _7 Yes _8 No Where  Sacral ulcer  (skin shear)                                      When   1.5 years ago                                            Hx of skin flap surgeries  _9 Yes _10 No Where                                              When                                                  Limited sitting  tolerance _11 Yes _12 No Hours spent sitting in wheelchair daily:     12 hours approximately;                                                     Complaint of Pain:  Please describe:   None reported;                                                                                                           Swelling/Edema:    Will have some swelling in ankles daily due to prolonged sitting;  ADL STATUS (in reference to wheelchair use):  Indep Assist Unable Indep with Equip Not assessed Comments  Dressing              X                                                           Eating      X                                                                                                                        Toileting               X                                                   Has catheter, uses bedside commode                                           Bathing                             X                                  Home aide does bed bath        Grooming/ Hygiene              X                                                                                                                Meal Prep                       X  IADLS               X                                                                                                   Bowel Management: _0 Continent  _1 Incontinent  _2 Accidents Comments:                                                  Bladder Management: _3 Continent  _4 Incontinent  _5 Accidents Comments:   Has suprapubic catheter     WHEELCHAIR SKILLS: Manual w/c Propulsion: _6 UE or LE strength and endurance sufficient to participate in ADLs using manual wheelchair Arm : _7 left _8 right   _9 Both      Distance:  200+ feet on carpet/linoleum                                     Foot:  _10 left _11 right   _12 Both  Operate Scooter: _13  Strength, hand grip, balance and transfer appropriate for use _14 Living environment is accessible for use of scooter  Operate Power w/c:  _15  Std. Joystick   _16  Alternative Controls Indep _17  Assist _18  Dependent/ unable _19  N/A _20   _21 Safe          _22  Functional      Distance:               Bed confined without wheelchair _23  Yes _24  No   STRENGTH/RANGE OF MOTION:  Range of Motion Strength  Shoulder         WFL                                                       4-/5                                             Elbow          WFL                                                    4/5                                                  Wrist/Hand          [facility]  4/5                                                           Hip             WFL                                                              4-/5                                                  Knee            [facility]                                                          4/5                                                        Ankle          WFL            4-/5                                                        While patient does have functional strength, his incoordination limits functional mobility; pt unable to touch finger to nose and is dysmetric with thumb to each finger;  MOBILITY/BALANCE:  _0  Patient is totally dependent for mobility                                                                                               Balance Transfers Ambulation  Sitting Balance: Standing Balance: _1  Independent _2  Independent/Modified Independent  _3  WFL     _4  WFL _5  Supervision _6  Supervision  _7  Uses UE for balance  _8  Supervision _9  Min Assist _10  Ambulates with Assist                           _11  Min Assist _12  Min assist _13  Mod Assist _14  Ambulates with Device:      _15   RW   _0  StW  _1  Cane  _2                 _3  Mod Assist _4  Mod assist _5  Max assist   _6  Max Assist _7  Max assist _8  Dependent _9  Indep. Short Distance Only  _10  Unable _11  Unable _12  Lift / Sling Required Distance (in feet)                             _13  Sliding board _14  Unable to Ambulate: (Explain: ataxia and impaired coordination limit gait ability)  Cardio Status:  _15 Intact  _16  Impaired   _17  NA                     BP: 150/73, HR 72, SPo2 98%         Respiratory Status:  _18 Intact   _19 Impaired   _20 NA                                     Orthotics/Prosthetics:   Does not wear any orthotics at this time;                                                                       Comments (Address manual vs power w/c vs scooter):   Patient is able to propel self in a manual chair. However he does fatigue quickly and is slow when negotiating tight spaces such as doorways or small hallways. He has impaired coordination in BUE which also limits function with manual chair. Patient also require max A for transfers from manual chair due to low seat; His wife reports that its easier for her to assist with transfers from power chair due to height of seat and ability to get closer to bed or transfer seat.  Patient reports being independent in operating a power chair at home.                                               Anterior / Posterior Obliquity Rotation-Pelvis                               PELVIS    _21  _22  _23   Neutral Posterior Anterior  _24  _25  _26   WFL Rt elev Lt elev  _27  _28  _29   WFL Right Left             Anterior Anterior     _30  Fixed _31  Other _32  Partly Flexible _33  Flexible   _34  Fixed _35  Other _36  Partly Flexible  _37  Flexible  _38  Fixed _39  Other _40  Partly Flexible  _41  Flexible   TRUNK  _42  _43  _44   [facility]  Thoracic  Lumbar  Kyphosis Lordosis  _45  _46  _47   WFL Convex Convex  Right Left _48 c-curve _49 s-curve                _50 multiple  _51  Neutral _52  Left-anterior _53  Right-anterior     _54   Fixed _0   Flexible _1  Partly Flexible  Other  _2  Fixed _3  Flexible _4  Partly Flexible_5  Other  _6  Fixed            _7  Flexible _8  Partly Flexible _9  Other    Position Windswept                   HIPS          _10            _11               _12  Neutral       Abduct        ADduct         _13           _14            _15   Neutral Right           Left      _16  Fixed _17  Subluxed _18  Partly Flexible                _19  Dislocated _20  Flexible  _21  Fixed _22  Other _23  Partly Flexible  _24  Flexible                 Foot Positioning Knee Positioning                              _25  WFL  _26 Lt _27 Rt _28  WFL  _29 Lt _30 Rt    KNEES ROM concerns: ROM concerns:    & Dorsi-Flexed _31 Lt _32 Rt                                   FEET Plantar Flexed _33 Lt _34 Rt      Inversion                 _35 Lt _36 Rt      Eversion                 _37 Lt _38 Rt     HEAD _39  Functional _40  Good Head Control                     & _41  Flexed         _42  Extended _43  Adequate Head Control    NECK _44  Rotated  Lt  _45  Lat Flexed Lt _46  Rotated  Rt _47  Lat Flexed Rt _48  Limited Head Control     _49  Cervical Hyperextension _50  Absent  Head Control     SHOULDERS ELBOWS WRIST& HAND                                Left     Right    Left     Right    Left     Right   U/E _51 Functional           _52 Functional                                 _53 Fisting             _54 Fisting      _55 elev   _56 dep      _57 elev   _58 dep       _59 pro -_60 retract     _61   pro  _0 retract _1 subluxed             _2 subluxed          Goals for Wheelchair Mobility  _3  Independence with mobility in the home with motor related ADLs (MRADLs)  _4  Independence with MRADLs in the community _5  Provide dependent mobility  _6  Provide recline     _7 Provide tilt   Goals for Seating system _8  Optimize pressure distribution _9  Provide support needed to facilitate function or safety _10  Provide corrective forces to assist with maintaining or improving posture _11  Accommodate client's posture:   current  seated postures and positions are not flexible or will not tolerate corrective forces _12  Client to be independent with relieving pressure in the wheelchair _13 Enhance physiological function such as breathing, swallowing, digestion  Simulation ideas/Equipment trials:   Pt was able to propel self with manual wheelchair with BUE/BLE approximately 200+ feet on carpet/linoleum. However fatigues quickly and has trouble negotiating narrow doorways;                                                                                              State why other equipment was unsuccessful:                                                                         [place] and JUSTIFICATION: [place]  Manufacturer:           Model:              Size: Width   18 "        Seat Depth   19"          _14 provide transport from point A to B _15 promote Indep mobility  _16 is not a safe, functional ambulator _17 walker or cane inadequate _18 non-standard width/depth necessary to accommodate anatomical measurement _19                             _20 Manual Mobility Base _21 non-functional ambulator    _22 Scooter/POV  _23 can safely operate  _24 can safely transfer   _25 has adequate trunk stability  _26 cannot functionally propel manual w/c  _27 Power Mobility Base (group 3)  _28 non-ambulatory  _29 cannot functionally propel manual wheelchair  _30  cannot functionally and safely operate scooter/POV _31 can safely operate and willing to  _32 Stroller Base _33 infant/child  _34 unable to propel manual wheelchair _35 allows for growth _36 non-functional ambulator _37 non-functional UE _38 Indep mobility is not a goal at this time  _39 Tilt  _40 Forward _41 Backward _42 Powered tilt  _43 Manual tilt  _44 change position against gravitational force on head and shoulders  _45 change position for pressure relief/cannot weight shift _46 transfers  _47 management of tone _48 rest periods _49 control edema _50 facilitate postural  control  _51                                       _52   Recline  _0 Power recline on power base _1 Manual recline on manual base  _2 accommodate femur to back angle  _3 bring to full recline for ADL care  _4 change position for pressure relief/cannot weight shift _5 rest periods _6 repositioning for transfers or clothing/diaper /catheter changes _7 head positioning  _8 Lighter weight required _9 self- propulsion  _10 lifting _11                                                 _12 Heavy Duty required _13 user weight greater than 250# _14 extreme tone/ over active movement _15 broken frame on previous chair _16                                     _17  Back  _18  Angle Adjustable _19  Custom molded                           _20 postural control _21 control of tone/spasticity _22 accommodation of range of motion _23 UE functional control _24 accommodation for seating system _25                                          _26 provide lateral trunk support _27 accommodate deformity _28 provide posterior trunk support _29 provide lumbar/sacral support _30 support trunk in midline _31 Pressure relief over spinal processes  _32  Seat Cushion acta embrace                        _33 impaired sensation  _34 decubitus ulcers present _35 history of pressure ulceration _36 prevent pelvic extension _37 low maintenance  _38 stabilize pelvis  _39 accommodate obliquity _40 accommodate multiple deformity _41 neutralize lower extremity position _42 increase pressure distribution _43                                           _44  Pelvic/thigh support  _45  Lateral thigh guide _46  Distal medial pad  _47  Distal lateral pad _48  pelvis in neutral _49 accommodate pelvis _50  position upper legs _51  alignment _52  accommodate ROM _53  decrease adduction _54 accommodate tone _55 removable for transfers _56 decrease abduction  _57  Lateral trunk Supports _58  Lt     _59  Rt _60 decrease lateral trunk leaning _61 control tone _62 contour for increased contact _63 safety  _64 accommodate asymmetry _65                                                  _66  Mounting hardware  _67 lateral trunk supports  _68 back   _69 seat _70 headrest      _71  thigh support _72 fixed   _73 swing away _74 attach seat platform/cushion to w/c frame _75 attach back cushion to w/c frame _76 mount postural supports _77 mount headrest  _78 swing medial thigh support away _79 swing lateral supports away for transfers  _80                                                     Armrests  _81   fixed _0 adjustable height _1 removable   _2 swing away  _3 flip back   _4 reclining _5 full length pads _6 desk    _7 pads tubular  _8 provide support with elbow at 90   _9 provide support for w/c tray _10 change of height/angles for variable activities _11 remove for transfers _12 allow to come closer to table top _13 remove for access to tables _14                                               Hangers/ Leg rests  _15 60 _16 70 _17 90 _18 elevating _19 heavy duty  _20 articulating _21 fixed _22 lift off _23 swing away     _24 power _25 provide LE support  _26 accommodate to hamstring tightness _27 elevate legs during recline   _28 provide change in position for Legs _29 Maintain placement of feet on footplate _30 durability _31 enable transfers _32 decrease edema _33 Accommodate lower leg length _34                                         Foot support Footplate    <JJOACZYSAYTKZSWF>_0<\/XNATFTDDUKGURKYH>_06 Lt  _36  Rt  _37  Center mount _38 flip up     _39 depth/angle adjustable _40 Amputee adapter    _41  Lt     _42  Rt _43 provide foot support _44 accommodate to ankle ROM _45 transfers _46 Provide support for residual extremity _47  allow foot to go under wheelchair base _48  decrease tone  _49                                                 _50  Ankle strap/heel loops _51 support foot on foot support _52 decrease extraneous movement _53 provide input to heel  _54 protect foot  Tires: _55 pneumatic  _56 flat free inserts  _57 solid  _58 decrease maintenance  _59 prevent frequent flats _60 increase shock absorbency _61 decrease pain from road shock _62 decrease spasms from  road shock _63                                              _64  Headrest  _65 provide posterior head support _66 provide posterior neck support _67 provide lateral head support _68 provide anterior head support _69 support during tilt and recline _70 improve feeding   _71 improve respiration _72 placement of switches _73 safety  _74 accommodate ROM  _75 accommodate tone _76 improve visual orientation  _77  Anterior chest strap _78  Vest _79  Shoulder retractors  _80 decrease forward movement of shoulder _81 accommodation of TLSO _82 decrease forward movement of trunk _83 decrease shoulder elevation _84 added abdominal support _85 alignment _86 assistance with shoulder control  _87                                               Pelvic Positioner _88 Belt _89 SubASIS bar _90 Dual Pull _91 stabilize tone _92 decrease falling out of chair/ **will not Decrease potential for sliding due to pelvic tilting _93 prevent excessive rotation _94 pad for protection over boney prominence _95 prominence comfort _96 special pull angle to control rotation _97   Upper ExtremitySupport _0 L   _1  R _2 Arm trough    _3 hand support _4  tray       _5 full tray _6 swivel mount _7 decrease edema      _8 decrease subluxation   _9 control tone   _10 placement for AAC/Computer/EADL _11 decrease gravitational pull on shoulders _12 provide midline positioning _13 provide support to increase UE function _14 provide hand support in natural position _15 provide work surface   POWER WHEELCHAIR CONTROLS  _16 Proportional  _17 Non-Proportional Type                                      _18 Left  _19 Right _20 provides access for controlling wheelchair   _21 lacks motor control to operate proportional drive control <ZSWFUXNATFTDDUKG>_2<\/RKYHCWCBJSEGBTDV>_76 unable to understand proportional controls  Actuator Control Module  _23 Single  _24 Multiple   _25 Allow the client to operate the power seat function(s) through the joystick control   _26 Safety Reset Switches _27 Used to change modes  and stop the wheelchair when driving in latch mode    _28 [employer]  _29 programming for accurate control _30 progressive Disease/changing condition _31 non-proportional drive control needed _32 Needed in order to operate power seat functions through joystick control   _33 Display box _34 Allows user to see in which mode and drive the wheelchair is set  _35 necessary for alternate controls    _36 Digital interface electronics _37 Allows w/c to operate when using alternative drive controls  <HYWVPXTGGYIRSWNI>_6<\/EVOJJKKXFGHWEXHB>_71 ASL Head Array _39 Allows client to operate wheelchair  through switches placed in tri-panel headrest  _40 Sip and puff with tubing kit _41 needed to operate sip and puff drive controls  <IRCVELFYBOFBPZWC>_5<\/ENIDPOEUMPNTIRWE>_31 Upgraded tracking electronics _43 increase safety when driving <VQMGQQPYPPJKDTOI>_7<\/TIWPYKDXIPJASNKN>_39 correct tracking when on uneven surfaces  _45 Mount for switches or joystick _46 Attaches switches to w/c  _47 Swing away for access or transfers _48 midline for optimal placement _49 provides for consistent access  _50 Attendant controlled joystick plus mount _51 safety _52 long distance driving <JQBHALPFXTKWIOXB>_3<\/ZHGDJMEQASTMHDQQ>_22 operation of seat functions _54 compliance with transportation regulations _55                                             Rear wheel placement/Axle adjustability _56 None _57 semi adjustable _58 fully adjustable  _59 improved UE access to wheels _60 improved stability _61 changing angle in space for improvement of postural stability _62 1-arm drive access <LNLGXQJJHERDEYCX>_4<\/GYJEHUDJSHFWYOVZ>_85 amputee pad placement _64                                Wheel rims/ hand rims  _65 metal  _66 plastic coated _67 oblique projections _68 vertical projections _69 Provide ability to propel manual wheelchair  _70  Increase self-propulsion with hand weakness/decreased grasp  Push handles _71 extended  _72 angle adjustable  _73 standard _74 caregiver access _75 caregiver assist _76 allows "hooking" to enable increased ability to perform ADLs or maintain balance  One armed device  _77 Lt   _78 Rt _79 enable propulsion of manual wheelchair with one arm   _80                                             Brake/wheel lock extension _81  Lt   _82  Rt _83 increase indep in applying wheel locks   _84 Side guards _85 prevent clothing getting caught in wheel or becoming soiled _86  prevent skin tears/abrasions  Battery:                                            [  x]to power wheelchair                                                         Other:                                                                                                                        The above equipment has a life- long use expectancy. Growth and changes in medical and/or functional conditions would be the exceptions. This is to certify that the therapist has no financial relationship with durable medical provider or manufacturer. The therapist will not receive remuneration of any kind for the equipment recommended in this evaluation.   Patient has mobility limitation that significantly impairs safe, timely participation in one or more mobility related ADL's.  (bathing, toileting, feeding, dressing, grooming, moving from room to room)                                                             _0  Yes _1  No Will mobility device sufficiently improve ability to participate and/or be aided in participation of MRADL's?      _2  Yes _3  No Can limitation be compensated for with use of a cane or walker?                                                                                _4  Yes _5  No Does patient or caregiver demonstrate ability/potential ability & willingness to safely use the mobility device?    _6  Yes _7  No Does patient's home environment support use of recommended mobility device?                                                         _8  Yes _9  No Does patient have sufficient upper extremity function necessary to functionally propel a manual wheelchair?     _10  Yes _11  No Does patient have sufficient strength and trunk stability to safely operate a POV (scooter)?                                         _12   Yes  _0  No Does patient need additional features/benefits provided by a power wheelchair for MRADL's in the home?        _1  Yes _2  No Does the patient demonstrate the ability to safely use a power wheelchair?                                                                  _3  Yes _4  No    Physician's Name Printed:                                                        Physician's Signature:  Date:     This is to certify that I, the above signed therapist have the following affiliations: _5  This DME provider _6  Manufacturer of recommended equipment _7  Patient's long term care facility _8  None of the above  Therapist Name/Signature:                                            Date:                                 PT Long Term Goals - 04/20/16 1606      PT LONG TERM GOAL #1   Title Pt and caregivers will understand PT recommendation and appropriate/safe use for wheelchair and seating for home use.   Time 1   Period Days   Status Achieved               Plan - 04/20/16 1601    Clinical Impression Statement 75 yo Male presents to therapy for power wheelchair; see above for justification for power chair with power tilt to improve positioning and pressure relief.    Rehab Potential Good   Clinical Impairments Affecting Rehab Potential positive: caregiver support; negative: redness on sacral area from pressure; patient's clinical presentation is evolving due to progressive MS and high fall risk;    PT Frequency One time visit   PT Treatment/Interventions Patient/family education;Wheelchair mobility training   Consulted and Agree with Plan of Care Patient      Patient will benefit from skilled therapeutic intervention in order to improve the following deficits and impairments:  Abnormal gait, Hypomobility, Increased edema, Impaired UE functional use, Decreased strength, Decreased activity tolerance, Decreased balance, Decreased mobility, Decreased  coordination, Decreased safety awareness  Visit Diagnosis: Other lack of coordination - Plan: PT plan of care cert/re-cert  Other abnormalities of gait and mobility - Plan: PT plan of care cert/re-cert      G-Codes - 24/26/83 1607    Functional Assessment Tool Used clinical judgement, functional mobility;    Functional Limitation Mobility: Walking and moving around   Mobility: Walking and Moving Around Current Status [phone]) At least 40 percent but less than 60 percent impaired, limited or restricted   Mobility: Walking and Moving Around Goal Status ([id number]) At least 40 percent but less than 60 percent impaired,  limited or restricted   Mobility: Walking and Moving Around Discharge Status [phone]) At least 40 percent but less than 60 percent impaired, limited or restricted       Problem List Patient Active Problem List   Diagnosis Date Noted  . Colon polyp 05/07/2015  . Multiple sclerosis ([place]) 10/17/2014  . Absolute anemia 04/16/2014  . 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 Blood in feces 01/15/2014  . Can't get food down 01/15/2014    Scott Valdez PT, DPT 04/20/2016, 4:09 PM  [place] [facility] [address] [place], [place], [address] Phone: [phone]   Fax:  [phone]  Name: VENSON FERENCZ MRN: [id number] Date of Birth: 27-Aug-1940

## 2016-05-27 ENCOUNTER — Ambulatory Visit: Payer: Medicare HMO

## 2016-05-27 ENCOUNTER — Other Ambulatory Visit: Payer: Medicare HMO

## 2016-06-04 ENCOUNTER — Other Ambulatory Visit: Payer: Medicare HMO

## 2016-06-09 ENCOUNTER — Ambulatory Visit
Admission: RE | Admit: 2016-06-09 | Discharge: 2016-06-09 | Disposition: A | Discharge status: Home / Self Care | Payer: Medicare HMO | Source: Ambulatory Visit | Attending: Urology | Admitting: Urology

## 2016-06-09 DIAGNOSIS — N281 Cyst of kidney, acquired: Secondary | ICD-10-CM | POA: Diagnosis not present

## 2016-06-09 DIAGNOSIS — N2889 Other specified disorders of kidney and ureter: Secondary | ICD-10-CM | POA: Insufficient documentation

## 2016-06-09 DIAGNOSIS — N319 Neuromuscular dysfunction of bladder, unspecified: Secondary | ICD-10-CM | POA: Insufficient documentation

## 2016-06-09 DIAGNOSIS — Q631 Lobulated, fused and horseshoe kidney: Secondary | ICD-10-CM | POA: Insufficient documentation

## 2016-06-11 ENCOUNTER — Encounter: Payer: Self-pay | Admitting: Urology

## 2016-06-11 ENCOUNTER — Ambulatory Visit: Payer: Medicare HMO | Admitting: Urology

## 2016-06-11 VITALS — BP 138/79 | HR 73 | Ht 67.0 in | Wt 200.0 lb

## 2016-06-11 DIAGNOSIS — N281 Cyst of kidney, acquired: Secondary | ICD-10-CM | POA: Diagnosis not present

## 2016-06-11 DIAGNOSIS — N319 Neuromuscular dysfunction of bladder, unspecified: Secondary | ICD-10-CM

## 2016-06-11 DIAGNOSIS — R339 Retention of urine, unspecified: Secondary | ICD-10-CM | POA: Diagnosis not present

## 2016-06-11 MED ORDER — CIPROFLOXACIN HCL 500 MG PO TABS
500.0000 mg | ORAL_TABLET | Freq: Once | ORAL | Status: AC
  Administered 2016-06-11: 500 mg via ORAL

## 2016-06-11 NOTE — Progress Notes (Signed)
06/11/2016 11:27 AM   Scott Valdez 1941/02/27 [id number]  Referring provider: Rafael Bihari, MD [phone] [facility] [address] [facility] [place], [place] [address]  Chief Complaint  Patient presents with  . Cysto    urinary retention, suprapubic tube     HPI: Patient is a 76 year old man with a history of multiple sclerosis and urinary incontinence managed with a suprapubic tube since 1990. He presents today with his wife with complaints of clogging of catheter and leakage from his penis. His wife reports over the last month she has noticed increased sediment in his catheter tubing and clogging of the tube. She reports that she can manipulate the tubing and sometimes unclog it herself. Most recent catheter change was 1 week ago by home health nurse. They have noted a strong odor to his urine. 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 He states that he is also had bladder stones in the past that were removed by Dr. Orson Slick. He has not been seen in our office since May of last year. He denies any systemic symptoms including fevers, abdominal pain, malaise or flank pain. No gross hematuria.  Per Dr. Scheryl Darter last note patient has a 4 mm nonobstructive stone in the left kidney and a small lung nodule that was seen on CT scan needing follow-up in one year. He recommended no intervention for his kidney stone unless patient becomes symptomatic. Last office visit 09/2014.     January 2017 interval history: The patient presents for follow-up with cystoscopy today. He had a renal ultrasound and KUB KUB was positive for  stones. There is 2 echogenic regions and his horseshoe kidney that maybe 4-5 mm stone. They're not visible on KUB    February 2018 interval history: The patient does have issues with his catheter clog required to be changed every 2 days. Cystoscopy the his SP tube track today was negative. He has no signs of nephrolithiasis and his horseshoe kidney on  renal ultrasound today. He does have complex cysts of that weren't as apparent as on last ultrasound. Both approximately 3 cm one is on the left and one is on the right.    PMH: Past Medical History:  Diagnosis Date  . Chronic suprapubic catheter (HCC)   . Gross hematuria   . MS (multiple sclerosis) (HCC)   . Urinary retention     Surgical History: Past Surgical History:  Procedure Laterality Date  . Cystolithopaxy    . Suprapubic tube placement      Home Medications:  Allergies as of 06/11/2016      Reactions   Pseudoephedrine Hcl    Other reaction(s): Unknown   Ranitidine Hcl    Other reaction(s): Unknown   Sucralfate    Other reaction(s): Unknown      Medication List       Accurate as of 06/11/16 11:27 AM. Always use your most recent med list.          levothyroxine 175 MCG tablet Commonly known as:  SYNTHROID, LEVOTHROID Take by mouth.       Allergies:  Allergies  Allergen Reactions  . Pseudoephedrine Hcl     Other reaction(s): Unknown  . Ranitidine Hcl     Other reaction(s): Unknown  . Sucralfate     Other reaction(s): Unknown    Family History: Family History  Problem Relation Age of Onset  . Diabetes Mother     Social History:  reports that he quit smoking about  46 years ago. He has never used smokeless tobacco. He reports that he does not drink alcohol. His drug history is not on file.  ROS:                                        Physical Exam: BP 138/79   Pulse 73   Ht 5\' 7"  (1.702 m)   Wt 200 lb (90.7 kg) Comment: wheel chair bound  BMI 31.32 kg/m   Constitutional:  Alert and oriented, No acute distress. HEENT: [place] AT, moist mucus membranes.  Trachea midline, no masses. Cardiovascular: No clubbing, cyanosis, or edema. Respiratory: Normal respiratory effort, no increased work of breathing. GI: Abdomen is soft, nontender, nondistended, no abdominal masses GU: No CVA tenderness.  Skin: No rashes, bruises or  suspicious lesions. Lymph: No cervical or inguinal adenopathy. Neurologic: Grossly intact, no focal deficits, moving all 4 extremities. Psychiatric: Normal mood and affect.  Laboratory Data: No results found for: WBC, HGB, HCT, MCV, PLT  No results found for: CREATININE  No results found for: PSA  No results found for: TESTOSTERONE  No results found for: HGBA1C  Urinalysis    Component Value Date/Time   APPEARANCEUR Cloudy (A) 05/10/2015 1056   GLUCOSEU Negative 05/10/2015 1056   BILIRUBINUR Negative 05/10/2015 1056   PROTEINUR 1+ (A) 05/10/2015 1056   NITRITE Positive (A) 05/10/2015 1056   LEUKOCYTESUR 1+ (A) 05/10/2015 1056    Pertinent Imaging: CLINICAL DATA:  Known horseshoe kidney. Neurogenic bladder. Patient with MS.  EXAM: RENAL / URINARY TRACT ULTRASOUND COMPLETE  COMPARISON:  Abdominal CT 09/18/2013  FINDINGS: Horseshoe kidney configuration.  Right Kidney:  Length: 9.8 cm. Echogenicity within normal limits. There is a complex cystic mass in the lower pole of the right kidney, at the junction with the left kidney, which measures 3.0 by 2.8 by 2.9 cm. No evidence of hydronephrosis.  Left Kidney:  Length: 13.3 cm. Echogenicity within normal limits. Upper pole simple cyst measures 5.1 x 4.6 x 4.3 cm. Lower pole circumscribed hypoechoic mass without internal vascularity measures 3.6 x 2.6 x 2.2 cm. No evidence of hydronephrosis.  Bladder:  Poorly characterized due to decompression, secondary to presence of suprapubic Foley catheter.  IMPRESSION: Horseshoe configuration of the kidneys.  3 cm complex cystic mass in the lower pole of the right kidney, indeterminate.  3.6 cm probable complicated cyst in the lower pole of the left kidney.  5.1 cm simple cyst in the upper pole of the left kidney.  No evidence of hydronephrosis.  Follow-up with ultrasound in 3-6 months or MRI of the abdomen may be considered.    Cystoscopy  Procedure Note  Patient identification was confirmed, informed consent was obtained, and patient was prepped using Betadine solution.      Preoperative abx where received prior to procedure.    Procedure: The flexible cystoscope was introduced without difficulty through SP tract - Bilateral ureteral orifices identified - Bladder mucosa  reveals no ulcers, tumors, or lesions - No bladder stones - No trabeculation  Retroflexion shows no tumor at [facility] tract entrance  A fresh 18 Fr SP tube was placed.  Post-Procedure: - Patient tolerated the procedure well   Assessment & Plan:   1. Urinary retention .- continue suprapubic tube. He will have his SP tube changed by his home nurse monthly or sooner if needed. His wife is instructed to irrigate the tube daily. He'll be  due for repeat cystoscopy in one year due to chronic suprapubic tube.  2. Horseshoe Kidney with renal calculi No evidence of stones on recent ultrasound  3. Complex renal cysts The cyst each of a small septation and are not overly concerning. Given the patient's overall medical status and his MS, we will plan on surveillance with a repeat ultrasound in 1 year prior to follow-up.  Return in about 1 year (around 06/11/2017) for for cysto with renal u/s prior.  Hildred Laser, MD  [facility] [address] [place], [place] [address] [phone]

## 2017-02-07 IMAGING — US US RENAL
1 series · 14 of 25 positions shown · non-contrast
Comparison: Abdominal CT 09/18/2013

CLINICAL DATA: Known horseshoe kidney. Neurogenic bladder. Patient
with MS.

EXAM:
RENAL / URINARY TRACT ULTRASOUND COMPLETE

[Series 1: us renal · 0.26mm/px · 14 of 53 slices shown]
[im 1/53]
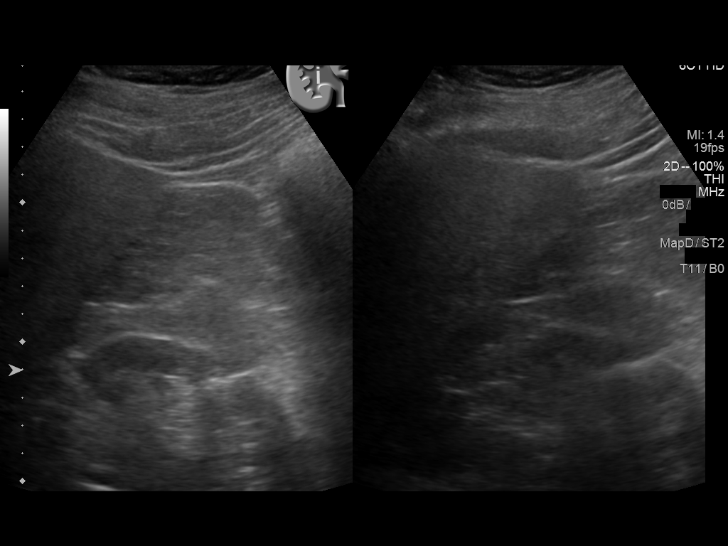
[im 5/53]
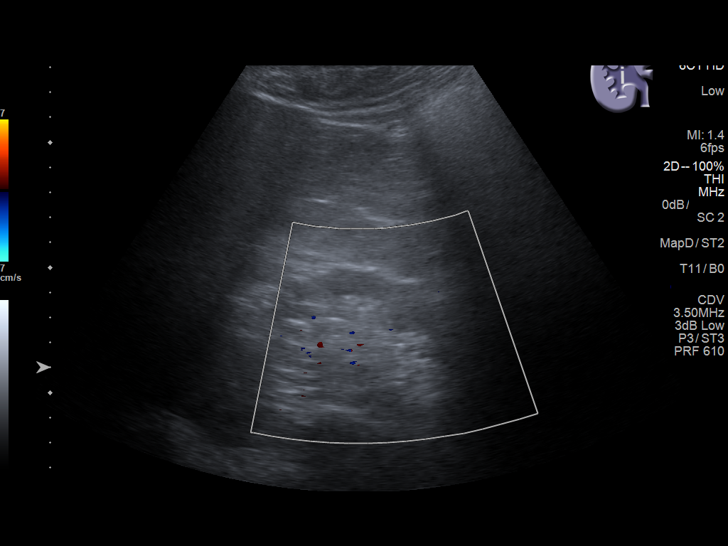
[im 9/53]
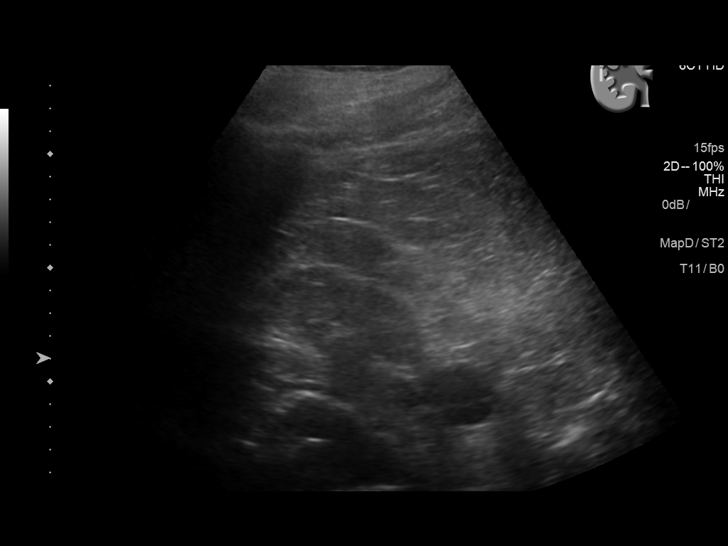
[im 14/53]
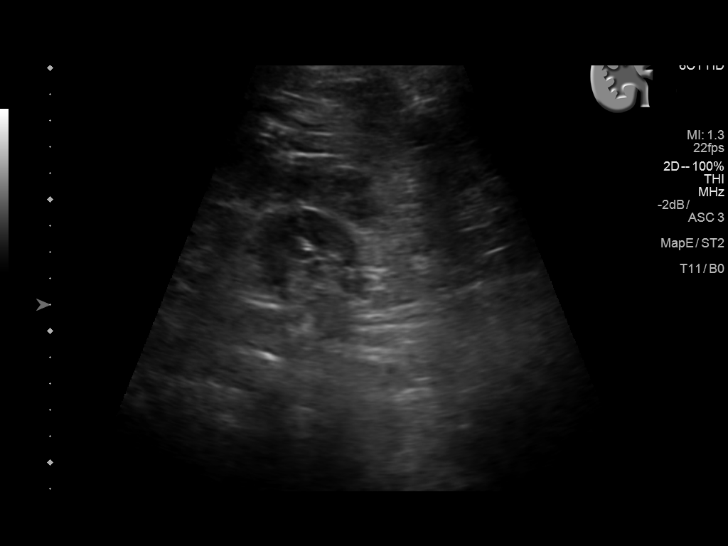
[im 18/53]
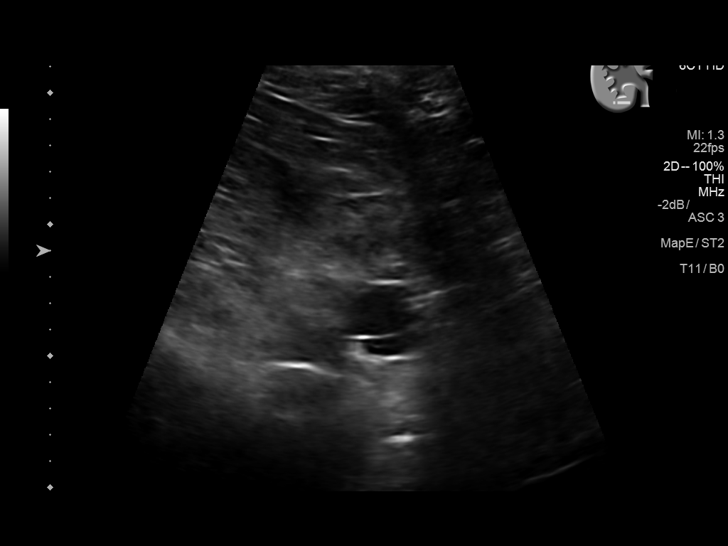
[im 20/53]
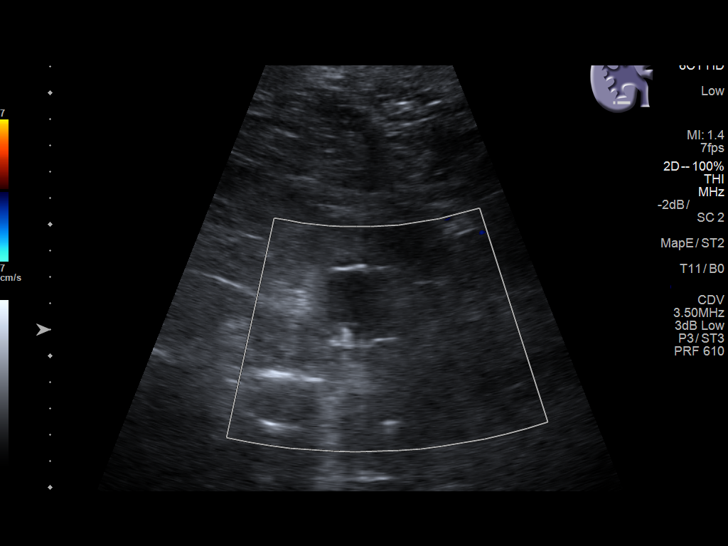
[im 24/53]
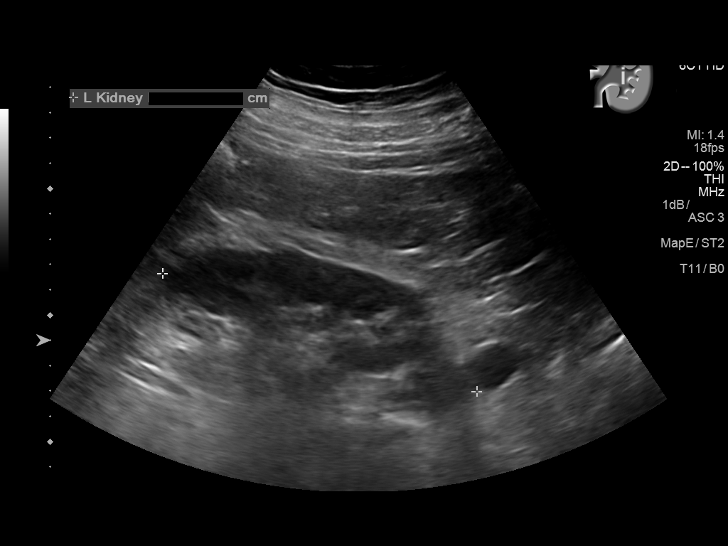
[im 29/53]
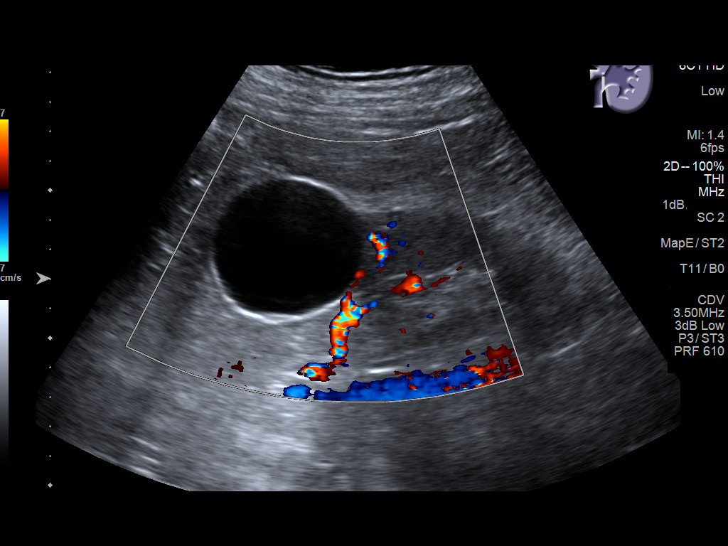
[im 33/53]
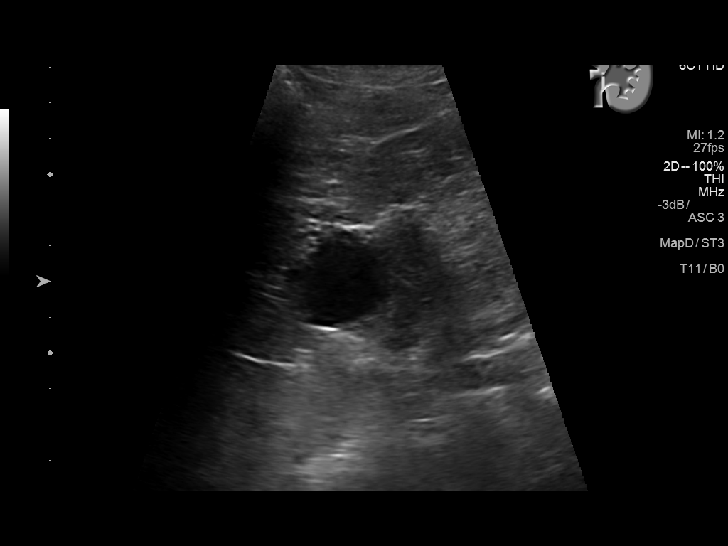
[im 35/53]
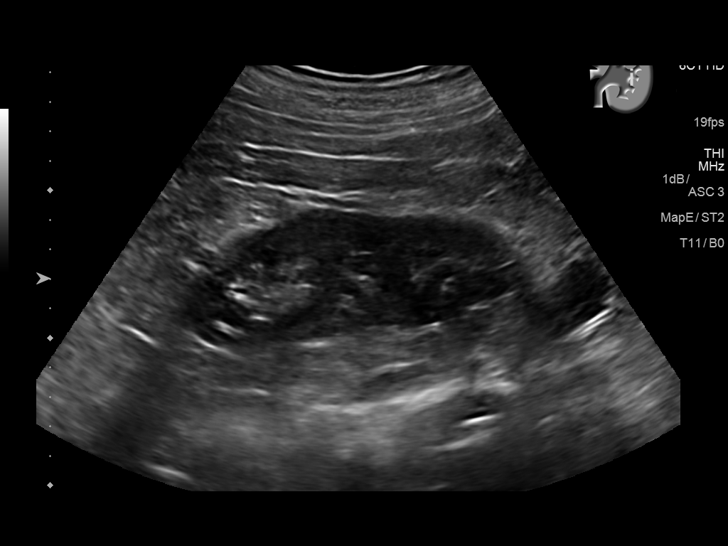
[im 40/53]
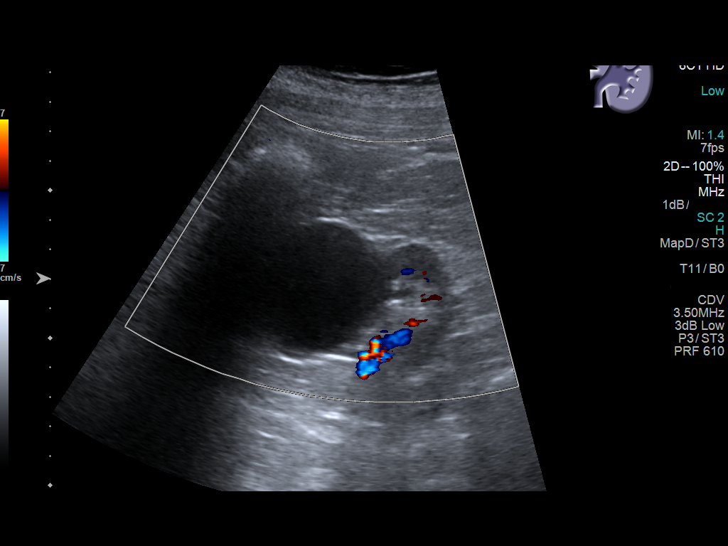
[im 44/53]
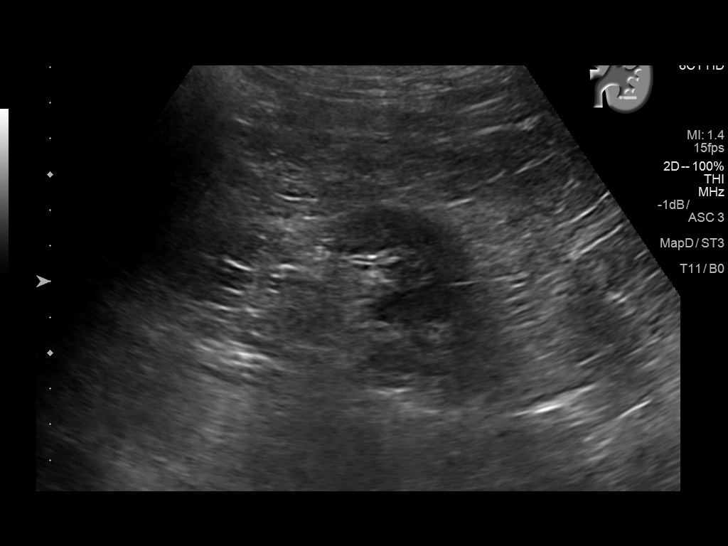
[im 48/53]
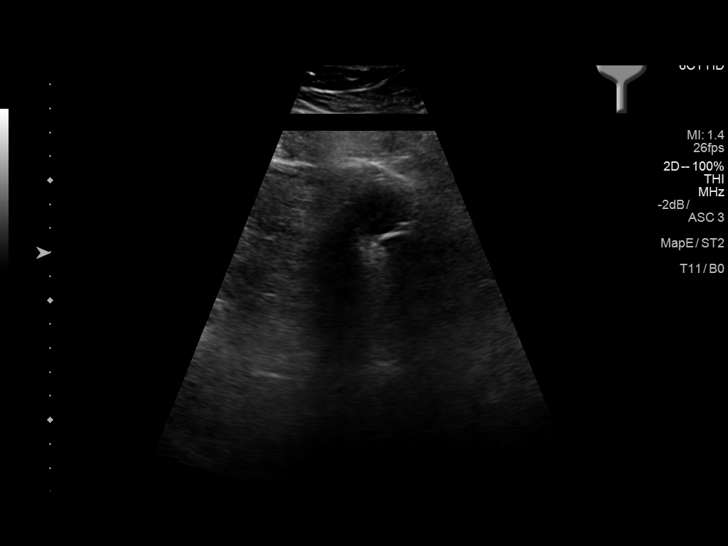
[im 53/53]
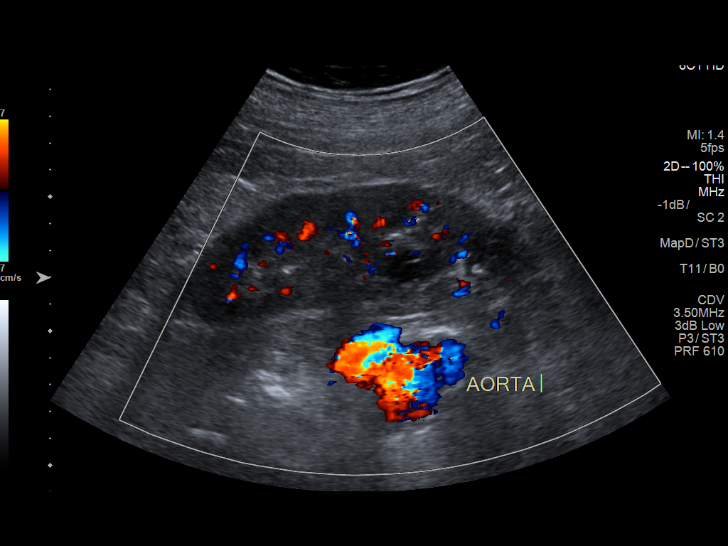

[14 of 25 positions shown; findings below may reference images not displayed]

FINDINGS: Horseshoe kidney configuration.

Right Kidney:

Length: 9.8 cm. Echogenicity within normal limits. There is a
complex cystic mass in the lower pole of the right kidney, at the
junction with the left kidney, which measures 3.0 by 2.8 by 2.9 cm.
No evidence of hydronephrosis.

Left Kidney:

Length: 13.3 cm. Echogenicity within normal limits. Upper pole
simple cyst measures 5.1 x 4.6 x 4.3 cm. Lower pole circumscribed
hypoechoic mass without internal vascularity measures 3.6 x 2.6 x
2.2 cm. No evidence of hydronephrosis.

Bladder:

Poorly characterized due to decompression, secondary to presence of
suprapubic Foley catheter.
IMPRESSION: Horseshoe configuration of the kidneys.

3 cm complex cystic mass in the lower pole of the right kidney,
indeterminate.

3.6 cm probable complicated cyst in the lower pole of the left
kidney.

5.1 cm simple cyst in the upper pole of the left kidney.

No evidence of hydronephrosis.

Follow-up with ultrasound in 3-6 months or MRI of the abdomen may be
considered.

## 2017-06-02 ENCOUNTER — Ambulatory Visit: Payer: Medicare HMO

## 2017-06-09 ENCOUNTER — Ambulatory Visit
Admission: RE | Admit: 2017-06-09 | Discharge: 2017-06-09 | Disposition: A | Discharge status: Home / Self Care | Payer: Medicare HMO | Source: Ambulatory Visit | Attending: Urology | Admitting: Urology

## 2017-06-09 DIAGNOSIS — N281 Cyst of kidney, acquired: Secondary | ICD-10-CM | POA: Diagnosis not present

## 2017-06-10 ENCOUNTER — Encounter: Payer: Self-pay | Admitting: Urology

## 2017-06-10 ENCOUNTER — Ambulatory Visit: Payer: Medicare HMO | Admitting: Urology

## 2017-06-10 VITALS — BP 144/75 | HR 72 | Ht 67.0 in | Wt 210.0 lb

## 2017-06-10 DIAGNOSIS — N319 Neuromuscular dysfunction of bladder, unspecified: Secondary | ICD-10-CM

## 2017-06-10 MED ORDER — CIPROFLOXACIN HCL 500 MG PO TABS
500.0000 mg | ORAL_TABLET | Freq: Once | ORAL | Status: AC
  Administered 2017-06-10: 500 mg via ORAL

## 2017-06-10 MED ORDER — LIDOCAINE HCL 2 % EX GEL: 1.0000 "application " | Freq: Once | CUTANEOUS | Status: DC

## 2017-06-10 NOTE — Progress Notes (Signed)
   06/10/17  CC:  Chief Complaint  Patient presents with  . Cysto    HPI: Patient is a 77 year old man with a history of multiple sclerosis and urinary incontinence managed with a suprapubic tube since 1990. Patient does also have a history of renal stones and horseshoe kidney. He has has had bladder stones in the past that were removed by Dr. Orson Slick.  He also has known renal cysts and underwent a recent renal ultrasound to monitor them prior to this visit.  His suprapubic catheter is changed monthly by his home health nurse.  His renal ultrasound does not reveal any sign of nephrolithiasis.  The cysts appear benign in both my read as well as the radiologist's read.   There were no vitals taken for this visit. NED. A&Ox3.   No respiratory distress   Abd soft, NT, ND Normal phallus with bilateral descended testicles  Cystoscopy Procedure Note  Patient identification was confirmed, informed consent was obtained, and patient was prepped using Betadine solution.  Lidocaine jelly was administered per urethral meatus.    Preoperative abx where received prior to procedure.     Pre-Procedure: - Inspection reveals a normal caliber ureteral meatus.  Procedure: The flexible cystoscope was introduced without difficulty via SP tract - Bilateral ureteral orifices identified - Bladder mucosa  reveals no ulcers, tumors, or lesions - No bladder stones - No trabeculation  Foley replaced in SP tract at end of procedure.   Post-Procedure: - Patient tolerated the procedure well  Assessment/ Plan:  1. Urinary retention .- continue suprapubic tube. He will have his SP tube changed by his home nurse monthly or sooner if needed. He'll be due for repeat cystoscopy in one year due to chronic suprapubic tube.  2. Horseshoe Kidney with history of renal calculi No evidence of stones on recent ultrasound  3. Bilateral renal cysts These appear benign on most recent ultrasound.  No further workup  needed.

## 2017-10-19 ENCOUNTER — Encounter: Payer: Self-pay | Admitting: Urology

## 2017-10-19 ENCOUNTER — Ambulatory Visit: Payer: Medicare HMO | Admitting: Urology

## 2017-10-19 VITALS — BP 154/84 | HR 68

## 2017-10-19 DIAGNOSIS — N319 Neuromuscular dysfunction of bladder, unspecified: Secondary | ICD-10-CM

## 2017-10-19 MED ORDER — FESOTERODINE FUMARATE ER 8 MG PO TB24: 8.0000 mg | ORAL_TABLET | Freq: Every day | ORAL | 3 refills | Status: DC

## 2017-10-19 NOTE — Progress Notes (Signed)
10/19/2017 1:35 PM   Scott Valdez Aug 21, 1940 [id number]  Referring provider: Gracelyn Nurse, MD [address] [place], [place] [address]  Chief Complaint  Patient presents with  . Follow-up    HPI: 77 year old male with multiple sclerosis and a neurogenic bladder which has been managed with a suprapubic tube since 1990.  He last saw Dr. Sherryl Barters in February 2019 for cystoscopy which was unremarkable.  A renal ultrasound showed no upper tract abnormalities.  Apparently his home health nurse recommended he come in for evaluation.  He has had incontinence per urethra and some difficulty with his monthly SP tube changes.  He currently has an 67 [place] indwelling suprapubic tube which was last changed on 10/05/2017.   PMH: Past Medical History:  Diagnosis Date  . Chronic suprapubic catheter (HCC)   . Gross hematuria   . MS (multiple sclerosis) (HCC)   . Urinary retention     Surgical History: Past Surgical History:  Procedure Laterality Date  . Cystolithopaxy    . Suprapubic tube placement      Home Medications:  Allergies as of 10/19/2017      Reactions   Pseudoephedrine Hcl    Other reaction(s): Unknown   Ranitidine Hcl    Other reaction(s): Unknown   Sucralfate    Other reaction(s): Unknown      Medication List        Accurate as of 10/19/17  1:35 PM. Always use your most recent med list.          levothyroxine 175 MCG tablet Commonly known as:  SYNTHROID, LEVOTHROID Take by mouth.       Allergies:  Allergies  Allergen Reactions  . Pseudoephedrine Hcl     Other reaction(s): Unknown  . Ranitidine Hcl     Other reaction(s): Unknown  . Sucralfate     Other reaction(s): Unknown    Family History: Family History  Problem Relation Age of Onset  . Diabetes Mother     Social History:  reports that he quit smoking about 47 years ago. He has never used smokeless tobacco. He reports that he has current or past drug history. He reports that he  does not drink alcohol.  ROS: UROLOGY Frequent Urination?: No Hard to postpone urination?: No Burning/pain with urination?: No Get up at night to urinate?: No Leakage of urine?: Yes Urine stream starts and stops?: No Trouble starting stream?: No Do you have to strain to urinate?: No Blood in urine?: Yes Urinary tract infection?: No Sexually transmitted disease?: No Injury to kidneys or bladder?: No Painful intercourse?: No Weak stream?: No Erection problems?: No Penile pain?: No  Gastrointestinal Nausea?: No Vomiting?: No Indigestion/heartburn?: No Diarrhea?: No Constipation?: No  Constitutional Fever: No Night sweats?: No Weight loss?: No Fatigue?: No  Skin Skin rash/lesions?: No Itching?: No  Eyes Blurred vision?: No Double vision?: No  Ears/Nose/Throat Sore throat?: No Sinus problems?: No  Hematologic/Lymphatic Swollen glands?: No Easy bruising?: No  Cardiovascular Leg swelling?: No Chest pain?: No  Respiratory Cough?: No Shortness of breath?: No  Endocrine Excessive thirst?: No  Musculoskeletal Back pain?: No Joint pain?: No  Neurological Headaches?: No Dizziness?: No  Psychologic Depression?: No Anxiety?: No  Physical Exam: BP (!) 154/84 (BP Location: Left Arm, Patient Position: Sitting, Cuff Size: Large)   Pulse 68   Constitutional:  Alert, No acute distress. HEENT: [name] AT, moist mucus membranes.  Trachea midline, no masses. Cardiovascular: No clubbing, cyanosis, or edema. Respiratory: Normal respiratory effort, no increased  work of breathing. GI: Abdomen is soft, nontender, nondistended, no abdominal masses GU: No CVA tenderness.  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  He is not on an anticholinergic medication and Rx Gala Murdoch was sent to his pharmacy.  His SP tube changes may be difficult secondary to encrustation and can  either increase his tube changes to every 3 weeks or upsized to a 20 [place] catheter.  He is due for cystoscopy February 2020.   Riki Altes, MD  [facility] [address] [place], [place] [address] [phone]

## 2017-10-21 ENCOUNTER — Other Ambulatory Visit: Payer: Self-pay | Admitting: Radiology

## 2017-10-21 ENCOUNTER — Telehealth: Payer: Self-pay | Admitting: Urology

## 2017-10-21 DIAGNOSIS — R339 Retention of urine, unspecified: Secondary | ICD-10-CM

## 2017-10-21 MED ORDER — FESOTERODINE FUMARATE ER 8 MG PO TB24: 8.0000 mg | ORAL_TABLET | Freq: Every day | ORAL | 3 refills | Status: DC

## 2017-10-21 NOTE — Telephone Encounter (Signed)
Pt wife called to inform that the medication sent in is still too expensive $140 for 30 day supply, states she was told something cheaper could be sent in. Please advise. Thanks

## 2017-10-22 NOTE — Telephone Encounter (Signed)
Called [employer], rep states that trospium or oxybutynin are the two low tier medications for this patient. Please advise thanks.

## 2017-10-22 NOTE — Telephone Encounter (Signed)
Need to find out what the preferred [facility] med is on his formulary

## 2017-10-27 MED ORDER — TROSPIUM CHLORIDE 20 MG PO TABS: 20.0000 mg | ORAL_TABLET | Freq: Two times a day (BID) | ORAL | 3 refills | Status: DC

## 2017-10-27 NOTE — Addendum Note (Signed)
Addended by: Irineo Axon C on: 10/27/2017 01:09 PM   Modules accepted: Orders

## 2017-10-27 NOTE — Telephone Encounter (Signed)
Rx trospium was sent

## 2018-02-07 IMAGING — US US RENAL
1 series · 14 of 25 positions shown · non-contrast
Comparison: June 09, 2016

CLINICAL DATA: Renal cysts.

EXAM:
RENAL / URINARY TRACT ULTRASOUND COMPLETE

[Series 1: us renal · 0.31mm/px · 14 of 46 slices shown]
[im 1/46]
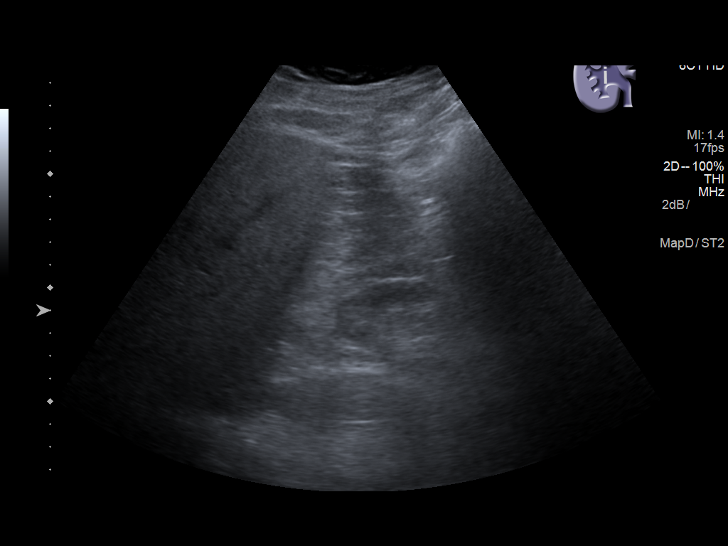
[im 4/46]
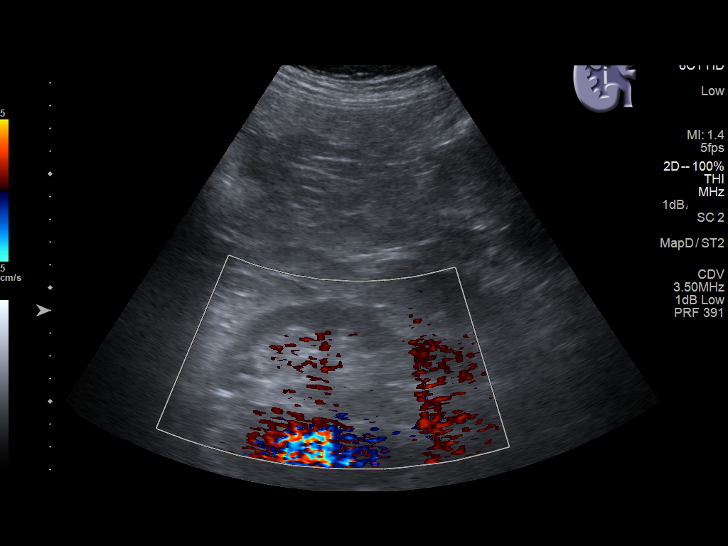
[im 8/46]
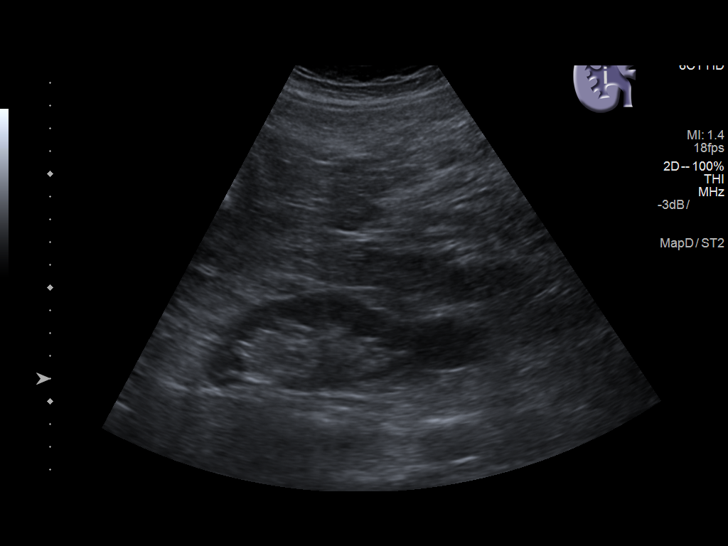
[im 12/46]
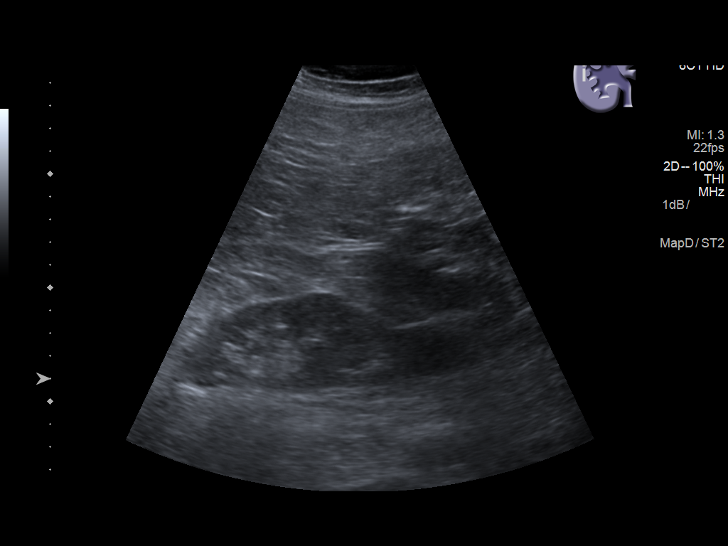
[im 16/46]
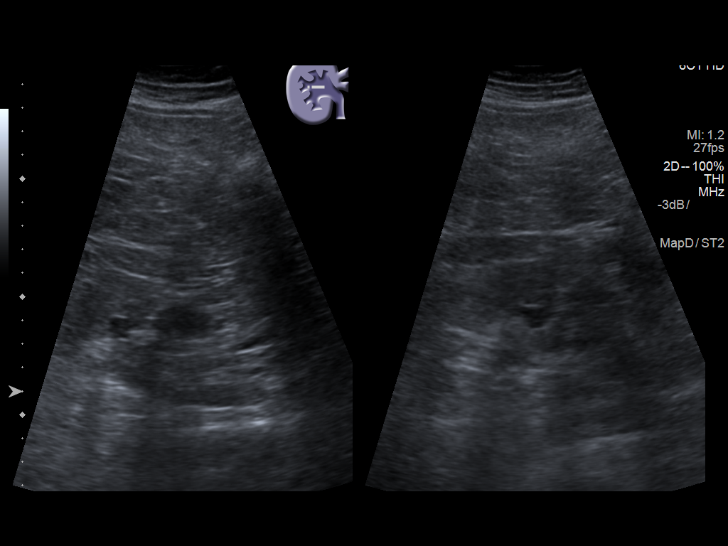
[im 17/46]
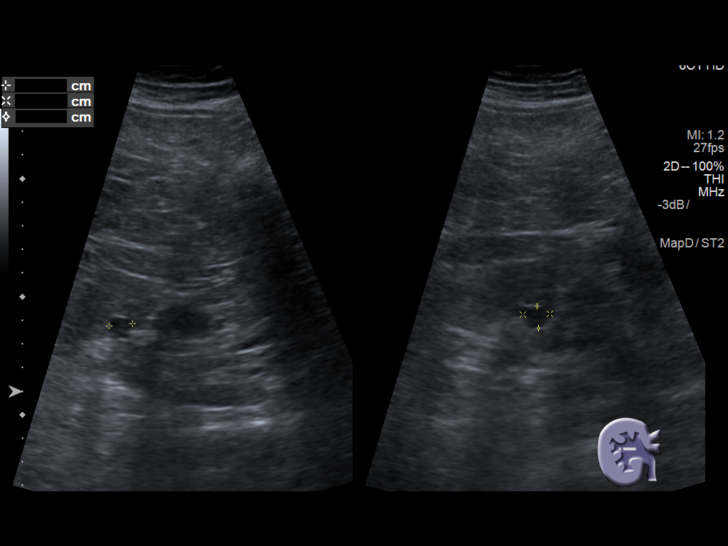
[im 21/46]
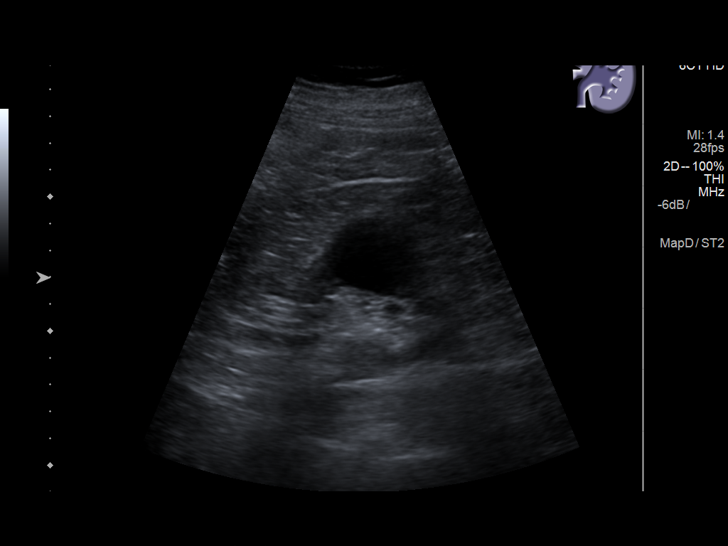
[im 25/46]
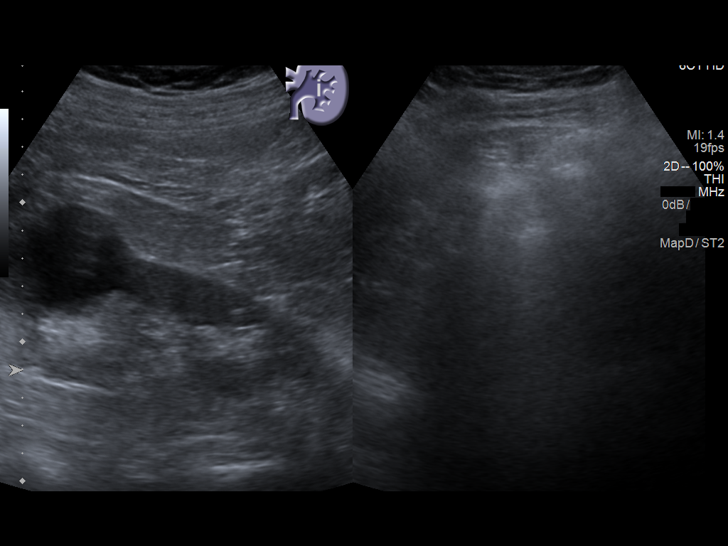
[im 29/46]
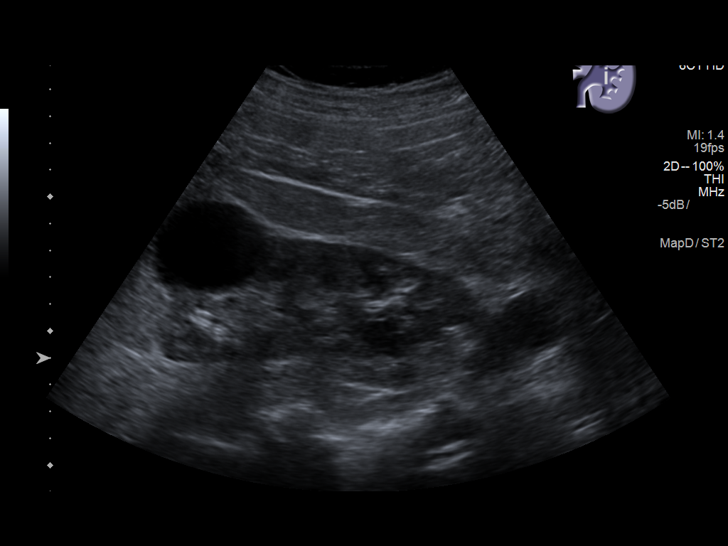
[im 31/46]
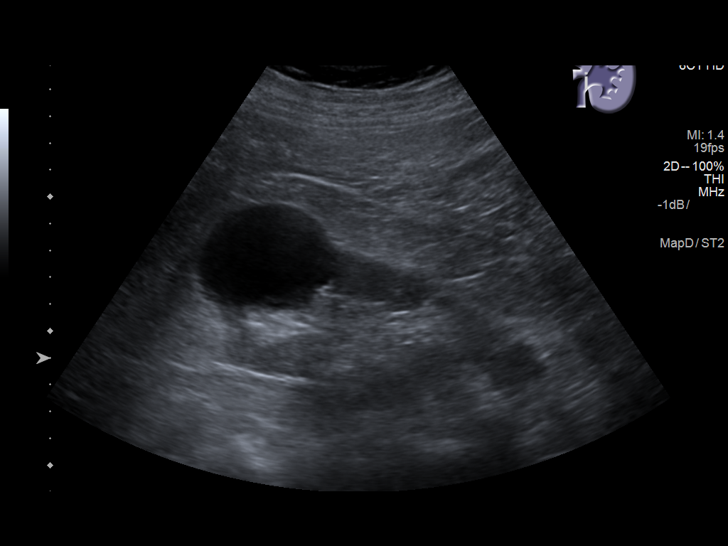
[im 34/46]
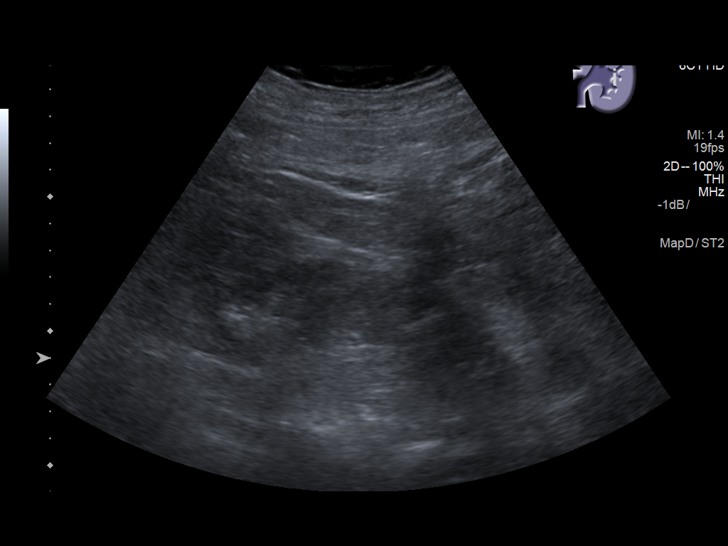
[im 38/46]
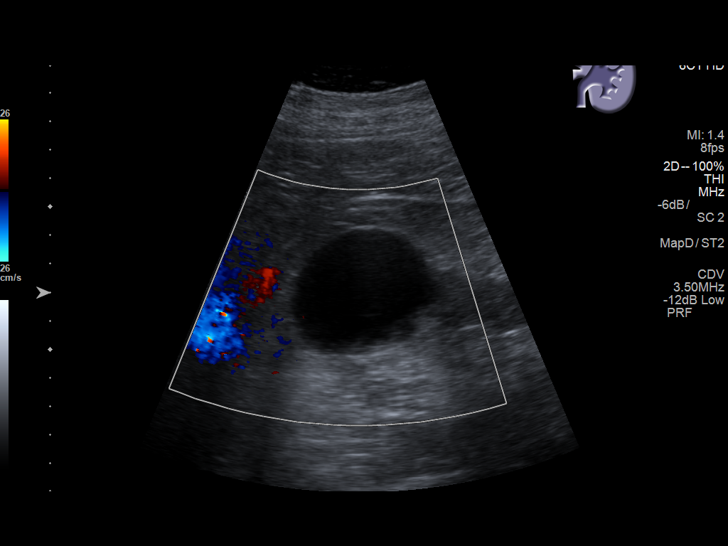
[im 42/46]
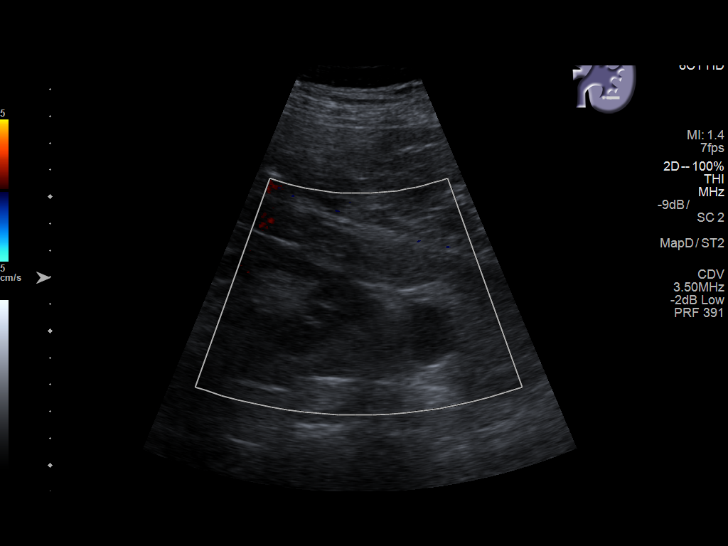
[im 46/46]
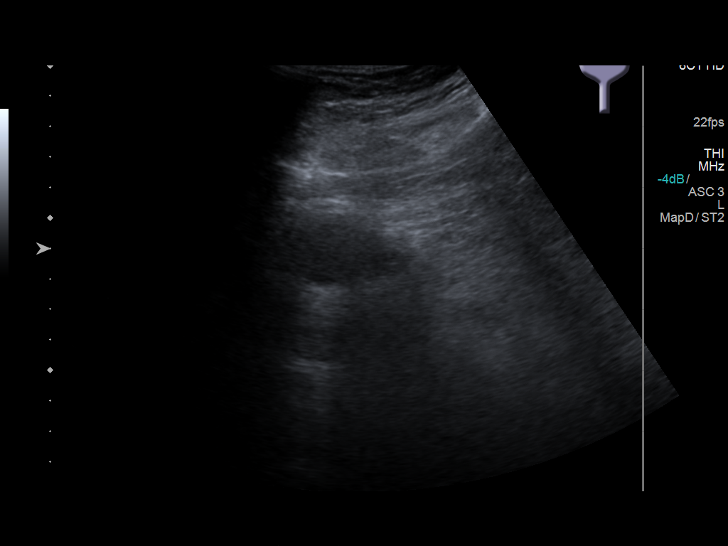

[14 of 25 positions shown; findings below may reference images not displayed]

FINDINGS: Right Kidney:

Length: 9.9 cm. Cyst in the mid and lower poles of the right kidney
measure 11 x 9 x 10 and 27 x 24 x 28 mm.

Left Kidney:

Length: 11.3 cm. Cysts in the left kidney measure 49 x 43 x 48 cm in
the upper pole and 22 x 23 x 22 mm in the lower pole.

Bladder:

The bladder is decompressed with a Foley catheter.
IMPRESSION: Bilateral renal cysts.

## 2018-02-15 ENCOUNTER — Other Ambulatory Visit: Payer: Self-pay | Admitting: Family Medicine

## 2018-02-15 MED ORDER — TROSPIUM CHLORIDE 20 MG PO TABS: 20.0000 mg | ORAL_TABLET | Freq: Two times a day (BID) | ORAL | 3 refills | Status: DC

## 2018-06-09 ENCOUNTER — Other Ambulatory Visit: Payer: Medicare HMO | Admitting: Urology

## 2018-06-09 NOTE — Progress Notes (Incomplete)
° °  06/09/2018  CC: No chief complaint on file.   HPI: Scott Valdez is a 78 yo with a history of multiple sclerosis and urinary incontinence managed with a suprapubic tube since 1990.  - Last seen by Dr. Sherryl Barters in February 2019 for cystoscopy which was unremarkable - RUS showed no upper tract abnormalities - Incontinence per urethra and some difficulty with his monthly SP tube changes  - Currently has an 77 French indwelling suprapubic tube last changed on 10/05/2017   There were no vitals taken for this visit. NED. A&Ox3.   No respiratory distress   Abd soft, NT, ND Normal phallus with bilateral descended testicles  Cystoscopy Procedure Note  Patient identification was confirmed, informed consent was obtained, and patient was prepped using Betadine solution.  Lidocaine jelly was administered per urethral meatus.     Pre-Procedure: - Inspection reveals a normal caliber ureteral meatus.  Procedure: The flexible cystoscope was introduced without difficulty - No urethral strictures/lesions are present. - {Blank multiple:19197::"Enlarged","Surgically absent","Normal"} prostate *** - {Blank multiple:19197::"Normal","Elevated","Tight"} bladder neck - Bilateral ureteral orifices identified - Bladder mucosa  reveals no ulcers, tumors, or lesions - No bladder stones - No trabeculation  Retroflexion shows ***   Post-Procedure: - Patient tolerated the procedure well  Assessment/ Plan:  1. 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  Letitia Neri, am acting as a Neurosurgeon for Dr. Lorin Picket C. Stoioff,  {Add Holiday representative

## 2018-06-10 ENCOUNTER — Ambulatory Visit: Payer: Medicare HMO | Admitting: Urology

## 2018-06-10 ENCOUNTER — Encounter: Payer: Self-pay | Admitting: Urology

## 2018-06-10 VITALS — BP 120/80 | HR 80

## 2018-06-10 DIAGNOSIS — R339 Retention of urine, unspecified: Secondary | ICD-10-CM

## 2018-06-10 DIAGNOSIS — N319 Neuromuscular dysfunction of bladder, unspecified: Secondary | ICD-10-CM

## 2018-06-10 NOTE — Patient Instructions (Signed)

## 2018-06-12 NOTE — Progress Notes (Signed)
   06/12/18  CC:  Chief Complaint  Patient presents with  . Cysto    HPI: 78 year old male with a history of multiple sclerosis and urinary incontinence managed with a suprapubic tube since 1990.  He presents today for annual surveillance cystoscopy  Blood pressure 120/80, pulse 80. NED.  Alert, in no acute distress.     Cystoscopy Procedure Note  Patient identification was confirmed, informed consent was obtained, and patient was prepped using Betadine solution.    Procedure: The flexible cystoscope was introduced through the suprapubic tract without difficulty - Moderate lateral lobe enlargement prostate  - Bilateral ureteral orifices identified - Bladder mucosa  reveals no ulcers, tumors, or lesions - No bladder stones - Moderate trabeculation   Post-Procedure: - Patient tolerated the procedure well  Assessment/ Plan: No significant abnormalities identified on cystoscopy.  An 3918 French suprapubic catheter was placed without difficulty.  Continue monthly suprapubic tube changes.  Return in about 1 year (around 06/11/2019) for Cystoscopy.  Riki AltesScott C Jesper Stirewalt, MD

## 2019-02-15 ENCOUNTER — Other Ambulatory Visit: Payer: Self-pay

## 2019-02-15 DIAGNOSIS — R339 Retention of urine, unspecified: Secondary | ICD-10-CM

## 2019-02-15 DIAGNOSIS — N319 Neuromuscular dysfunction of bladder, unspecified: Secondary | ICD-10-CM

## 2019-02-15 MED ORDER — TROSPIUM CHLORIDE 20 MG PO TABS
20.0000 mg | ORAL_TABLET | Freq: Two times a day (BID) | ORAL | 3 refills | Status: DC
Start: 2019-02-15 — End: 2019-05-15

## 2019-05-14 ENCOUNTER — Other Ambulatory Visit: Payer: Self-pay | Admitting: Urology

## 2019-05-14 DIAGNOSIS — N319 Neuromuscular dysfunction of bladder, unspecified: Secondary | ICD-10-CM

## 2019-06-15 ENCOUNTER — Other Ambulatory Visit: Payer: Self-pay

## 2019-06-15 ENCOUNTER — Ambulatory Visit: Payer: Medicare HMO | Admitting: Urology

## 2019-06-15 ENCOUNTER — Encounter: Payer: Self-pay | Admitting: Urology

## 2019-06-15 VITALS — BP 163/90 | HR 71 | Ht 67.0 in | Wt 210.0 lb

## 2019-06-15 DIAGNOSIS — R339 Retention of urine, unspecified: Secondary | ICD-10-CM | POA: Diagnosis not present

## 2019-06-15 DIAGNOSIS — N319 Neuromuscular dysfunction of bladder, unspecified: Secondary | ICD-10-CM

## 2019-06-15 DIAGNOSIS — G35 Multiple sclerosis: Secondary | ICD-10-CM

## 2019-06-15 NOTE — Patient Instructions (Signed)

## 2019-06-18 ENCOUNTER — Encounter: Payer: Self-pay | Admitting: Urology

## 2019-06-18 NOTE — Progress Notes (Signed)
   06/18/19  CC:  Chief Complaint  Patient presents with  . Cysto    HPI: 78y.o. male with a history of multiple sclerosis and urinary incontinence managed with suprapubic tube drainage since 1990.  He presents for annual surveillance cystoscopy.  Blood pressure (!) 163/90, pulse 71, height 5\' 7"  (1.702 m), weight 210 lb (95.3 kg).  Cystoscopy Procedure Note  Patient identification was confirmed, informed consent was obtained, and patient was prepped using Betadine solution.   Procedure: The flexible cystoscope was introduced through the suprapubic tract without difficulty - Moderate lateral lobe enlargement prostate  - Bilateral ureteral orifices identified - Bladder mucosa  reveals no ulcers, tumors, or lesions.  Inflammatory mucosal changes secondary to indwelling SP tube - No bladder stones - Moderate trabeculation   Post-Procedure: - Patient tolerated the procedure well  Assessment/ Plan: No significant abnormalities identified on cystoscopy.  An 25 French suprapubic catheter was placed without difficulty.  Continue monthly suprapubic tube changes.  Follow-up cystoscopy 1 year   15, MD

## 2020-02-15 ENCOUNTER — Other Ambulatory Visit: Payer: Self-pay

## 2020-02-15 ENCOUNTER — Emergency Department
Admission: EM | Admit: 2020-02-15 | Discharge: 2020-02-15 | Disposition: A | Discharge status: Home / Self Care | Payer: Medicare HMO | Attending: Emergency Medicine | Admitting: Emergency Medicine

## 2020-02-15 DIAGNOSIS — G35 Multiple sclerosis: Secondary | ICD-10-CM | POA: Insufficient documentation

## 2020-02-15 DIAGNOSIS — N390 Urinary tract infection, site not specified: Secondary | ICD-10-CM | POA: Insufficient documentation

## 2020-02-15 DIAGNOSIS — Z436 Encounter for attention to other artificial openings of urinary tract: Secondary | ICD-10-CM | POA: Diagnosis not present

## 2020-02-15 DIAGNOSIS — B9689 Other specified bacterial agents as the cause of diseases classified elsewhere: Secondary | ICD-10-CM | POA: Insufficient documentation

## 2020-02-15 DIAGNOSIS — E039 Hypothyroidism, unspecified: Secondary | ICD-10-CM | POA: Diagnosis not present

## 2020-02-15 DIAGNOSIS — R300 Dysuria: Secondary | ICD-10-CM | POA: Diagnosis present

## 2020-02-15 DIAGNOSIS — Z79899 Other long term (current) drug therapy: Secondary | ICD-10-CM | POA: Diagnosis not present

## 2020-02-15 DIAGNOSIS — T83010A Breakdown (mechanical) of cystostomy catheter, initial encounter: Secondary | ICD-10-CM

## 2020-02-15 DIAGNOSIS — Z87891 Personal history of nicotine dependence: Secondary | ICD-10-CM | POA: Insufficient documentation

## 2020-02-15 LAB — URINALYSIS, COMPLETE (UACMP) WITH MICROSCOPIC
Bilirubin Urine: NEGATIVE
Glucose, UA: NEGATIVE mg/dL
Ketones, ur: NEGATIVE mg/dL
Nitrite: NEGATIVE
Protein, ur: 30 mg/dL — AB
Specific Gravity, Urine: 1.005 (ref 1.005–1.030)
WBC, UA: >50 WBC/hpf — ABNORMAL HIGH (ref 0–5)
pH: 7 (ref 5.0–8.0)

## 2020-02-15 MED ORDER — AMLODIPINE BESYLATE 5 MG PO TABS
5.0000 mg | ORAL_TABLET | Freq: Once | ORAL | Status: AC
  Administered 2020-02-15: 5 mg via ORAL
  Filled 2020-02-15: qty 1

## 2020-02-15 MED ORDER — CEFDINIR 300 MG PO CAPS: 300.0000 mg | ORAL_CAPSULE | Freq: Two times a day (BID) | ORAL | 0 refills | Status: AC

## 2020-02-15 NOTE — ED Notes (Signed)
[facility] arrived for transport

## 2020-02-15 NOTE — ED Provider Notes (Signed)
[facility] Emergency Department Provider Note   ____________________________________________   First MD Initiated Contact with Patient 02/15/20 [phone]     (approximate)  I have reviewed the triage vital signs and the nursing notes.   HISTORY  Chief Complaint Dysuria (dislodged suprapubic catheter)    HPI Scott Valdez is a 79 y.o. male with a history of multiple sclerosis and chronic superpubic catheter who presents via EMS after his suprapubic catheter came out.  Per EMS, patient's catheter came out yesterday.  Patient is alert and oriented at his baseline but speech is significantly slurred secondary to MS and he is unable to participate in further history or review of systems.         Past Medical History:  Diagnosis Date  . Chronic suprapubic catheter (HCC)   . Gross hematuria   . MS (multiple sclerosis) (HCC)   . Urinary retention     Patient Active Problem List   Diagnosis Date Noted  . Colon polyp 05/07/2015  . Multiple sclerosis (HCC) 10/17/2014  . Absolute anemia 04/16/2014  . 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 Neurogenic bladder 04/16/2014  . Blood in feces 01/15/2014  . Can't get food down 01/15/2014    Past Surgical History:  Procedure Laterality Date  . Cystolithopaxy    . Suprapubic tube placement      Prior to Admission medications   Medication Sig Start Date End Date Taking? Authorizing Provider  levothyroxine (SYNTHROID, LEVOTHROID) 175 MCG tablet Take 175 mcg by mouth daily before breakfast.  05/07/15  Yes [provider]  nystatin (MYCOSTATIN/NYSTOP) powder Apply 1 application topically at bedtime. 01/05/20  Yes [provider]  cefdinir (OMNICEF) 300 MG capsule Take 1 capsule (300 mg total) by mouth 2 (two) times daily for 5 days. 02/15/20 02/20/20  Merwyn Katos, MD  trospium (SANCTURA) 20 MG tablet TAKE 1 TABLET BY MOUTH TWICE A DAY Patient not taking: Reported on 02/15/2020 05/15/19   Riki Altes, MD    Allergies Pseudoephedrine hcl, Ranitidine hcl, and Sucralfate  Family History  Problem Relation Age of Onset  . Diabetes Mother     Social History Social History   Tobacco Use  . Smoking status: Former Smoker    Quit date: 05/06/1970    Years since quitting: 49.8  . Smokeless tobacco: Never Used  Vaping Use  . Vaping Use: Never used  Substance Use Topics  . Alcohol use: No    Alcohol/week: 0.0 standard drinks  . Drug use: Not Currently    Review of Systems Unable to assess   ____________________________________________   PHYSICAL EXAM:  VITAL SIGNS: ED Triage Vitals  Enc Vitals Group     BP 02/15/20 0715 (!) 184/97     Pulse Rate 02/15/20 0715 70     Resp 02/15/20 0715 16     Temp 02/15/20 0715 97.7 F (36.5 C)     Temp Source 02/15/20 0715 Oral     SpO2 02/15/20 0715 93 %     Weight 02/15/20 0716 210 lb 4.8 oz (95.4 kg)     Height 02/15/20 0716 5\' 7"  (1.702 m)     Head Circumference --      Peak Flow --      Pain Score 02/15/20 0716 0     Pain Loc --      Pain Edu? --      Excl. in GC? --    Constitutional: Alert and oriented. Well appearing and in no acute  distress. Eyes: Conjunctivae are normal. PERRL. Head: Atraumatic. Nose: No congestion/rhinnorhea. Mouth/Throat: Mucous membranes are moist. Neck: No stridor Cardiovascular: Grossly normal heart sounds.  Good peripheral circulation. Respiratory: Normal respiratory effort.  No retractions. Gastrointestinal: Soft and nontender. No distention.  Stoma to suprapubic area without surrounding erythema, edema, or purulent drainage from the site Musculoskeletal: No obvious deformities Neurologic:  Normal speech and language. No gross focal neurologic deficits are appreciated. Skin:  Skin is warm and dry. No rash noted. Psychiatric: Mood and affect are normal. Speech and behavior are normal.  ____________________________________________   PROCEDURES  Procedure(s) performed (including  Critical Care):  ASPIRATION BLADDER INSERT SUPRAPUBIC CATHETER  Date/Time: 02/15/2020 8:00 AM Performed by: Merwyn Katos, MD Authorized by: Merwyn Katos, MD  Preparation: Patient was prepped and draped in the usual sterile fashion. Local anesthesia used: no  Anesthesia: Local anesthesia used: no  Sedation: Patient sedated: no  Patient tolerance: patient tolerated the procedure well with no immediate complications      ____________________________________________   INITIAL IMPRESSION / ASSESSMENT AND PLAN / ED COURSE  As part of my medical decision making, I reviewed the following data within the electronic MEDICAL RECORD NUMBER Nursing notes reviewed and incorporated, Labs reviewed, EKG interpreted, Old chart reviewed, Radiograph reviewed and Notes from prior ED visits reviewed and incorporated        Patient is an 79 year old male with a past medical history of multiple sclerosis with suprapubic catheter in place who presents via EMS after the catheter was dislodged overnight last night and was unable to be replaced by home health nurse this morning.  18 French suprapubic catheter was replaced at bedside under sterile technique.  Please see procedure note for full details.  Patient's urinalysis showed evidence of infection.  Patient will be placed on cefdinir for 5 days.  The patient has been reexamined and is ready to be discharged.  All diagnostic results have been reviewed and discussed with the patient/family.  Care plan has been outlined and the patient/family understands all current diagnoses, results, and treatment plans.  There are no new complaints, changes, or physical findings at this time.  All questions have been addressed and answered.  All medications, if any, that were given while in the emergency department or any that are being prescribed have been reviewed with the patient/family.  All side effects and adverse reactions have been explained.  Patient was instructed  to, and agrees to follow-up with their primary care physician as well as return to the emergency department if any new or worsening symptoms develop.      ____________________________________________   FINAL CLINICAL IMPRESSION(S) / ED DIAGNOSES  Final diagnoses:  Suprapubic catheter dysfunction, initial encounter [facility])  Lower urinary tract infectious disease     ED Discharge Orders         Ordered    cefdinir (OMNICEF) 300 MG capsule  2 times daily        02/15/20 0836           Note:  This document was prepared using Dragon voice recognition software and may include unintentional dictation errors.   Merwyn Katos, MD 02/15/20 [phone]

## 2020-02-15 NOTE — ED Triage Notes (Signed)
Pt presents to ER from home via ems with c/o his suprapubic catheter coming out.  Pt states he has had the catheter in for "90 days."  Per pt's wife, his last catheter "split," and came out.  Pt A&O x4 at this time but speech is slurred at baseline d/t MS dx.

## 2020-02-15 NOTE — ED Notes (Signed)
[facility] called for transport back home 

## 2020-02-15 NOTE — ED Notes (Signed)
Pt discharged home in ambulance with discharge instructions; nad noted.

## 2020-03-25 ENCOUNTER — Telehealth: Payer: Self-pay | Admitting: Radiology

## 2020-03-25 NOTE — Telephone Encounter (Signed)
Wife LMOM to return call. Unable to reach patient or wife. Unable to [facility].

## 2020-04-03 DEATH — deceased

## 2020-06-14 ENCOUNTER — Other Ambulatory Visit: Payer: Self-pay | Admitting: Urology
